# Patient Record
Sex: Female | Born: 1945
Health system: Southern US, Community
[De-identification: ages and names within clinical notes are randomized; demographics above are authoritative.]

## PROBLEM LIST (undated history)

## (undated) DIAGNOSIS — R000 Tachycardia, unspecified: Secondary | ICD-10-CM

## (undated) DIAGNOSIS — I499 Cardiac arrhythmia, unspecified: Secondary | ICD-10-CM

## (undated) DIAGNOSIS — I5189 Other ill-defined heart diseases: Secondary | ICD-10-CM

## (undated) DIAGNOSIS — I471 Supraventricular tachycardia, unspecified: Secondary | ICD-10-CM

## (undated) DIAGNOSIS — E119 Type 2 diabetes mellitus without complications: Secondary | ICD-10-CM

## (undated) DIAGNOSIS — R0600 Dyspnea, unspecified: Secondary | ICD-10-CM

## (undated) DIAGNOSIS — I4510 Unspecified right bundle-branch block: Secondary | ICD-10-CM

## (undated) DIAGNOSIS — I272 Pulmonary hypertension, unspecified: Secondary | ICD-10-CM

## (undated) DIAGNOSIS — F32A Depression, unspecified: Secondary | ICD-10-CM

## (undated) DIAGNOSIS — Y92009 Unspecified place in unspecified non-institutional (private) residence as the place of occurrence of the external cause: Secondary | ICD-10-CM

## (undated) DIAGNOSIS — I2721 Secondary pulmonary arterial hypertension: Secondary | ICD-10-CM

## (undated) DIAGNOSIS — I340 Nonrheumatic mitral (valve) insufficiency: Secondary | ICD-10-CM

## (undated) DIAGNOSIS — Z720 Tobacco use: Secondary | ICD-10-CM

## (undated) DIAGNOSIS — E785 Hyperlipidemia, unspecified: Secondary | ICD-10-CM

## (undated) DIAGNOSIS — I872 Venous insufficiency (chronic) (peripheral): Secondary | ICD-10-CM

## (undated) DIAGNOSIS — W19XXXA Unspecified fall, initial encounter: Secondary | ICD-10-CM

## (undated) DIAGNOSIS — R0609 Other forms of dyspnea: Secondary | ICD-10-CM

## (undated) DIAGNOSIS — I351 Nonrheumatic aortic (valve) insufficiency: Secondary | ICD-10-CM

## (undated) DIAGNOSIS — I2510 Atherosclerotic heart disease of native coronary artery without angina pectoris: Secondary | ICD-10-CM

## (undated) HISTORY — DX: Dyspnea, unspecified: R06.00

## (undated) HISTORY — DX: Hyperlipidemia, unspecified: E78.5

## (undated) HISTORY — DX: Other forms of dyspnea: R06.09

## (undated) HISTORY — PX: MUSCLE BIOPSY: SHX716

## (undated) HISTORY — DX: Tachycardia, unspecified: R00.0

## (undated) HISTORY — DX: Tobacco use: Z72.0

## (undated) HISTORY — DX: Atherosclerotic heart disease of native coronary artery without angina pectoris: I25.10

## (undated) HISTORY — PX: EYE SURGERY: SHX253

## (undated) HISTORY — DX: Venous insufficiency (chronic) (peripheral): I87.2

## (undated) HISTORY — DX: Secondary pulmonary arterial hypertension: I27.21

## (undated) HISTORY — DX: Other ill-defined heart diseases: I51.89

---

## 2001-04-29 HISTORY — PX: JOINT REPLACEMENT: SHX530

## 2004-03-09 ENCOUNTER — Ambulatory Visit: Payer: Self-pay

## 2004-03-14 ENCOUNTER — Ambulatory Visit: Payer: Self-pay | Admitting: Unknown Physician Specialty

## 2004-04-11 ENCOUNTER — Ambulatory Visit: Payer: Self-pay | Admitting: Pain Medicine

## 2004-04-16 ENCOUNTER — Ambulatory Visit: Payer: Self-pay | Admitting: Pain Medicine

## 2004-04-25 ENCOUNTER — Ambulatory Visit: Payer: Self-pay | Admitting: Pain Medicine

## 2004-06-05 ENCOUNTER — Ambulatory Visit: Payer: Self-pay | Admitting: Pain Medicine

## 2004-06-13 ENCOUNTER — Ambulatory Visit: Payer: Self-pay | Admitting: Pain Medicine

## 2004-07-02 ENCOUNTER — Ambulatory Visit: Payer: Self-pay | Admitting: Pain Medicine

## 2004-07-04 ENCOUNTER — Ambulatory Visit: Payer: Self-pay | Admitting: Pain Medicine

## 2004-08-07 ENCOUNTER — Ambulatory Visit: Payer: Self-pay | Admitting: Pain Medicine

## 2004-08-15 ENCOUNTER — Ambulatory Visit: Payer: Self-pay | Admitting: Pain Medicine

## 2004-09-11 ENCOUNTER — Ambulatory Visit: Payer: Self-pay | Admitting: Pain Medicine

## 2004-09-17 ENCOUNTER — Ambulatory Visit: Payer: Self-pay | Admitting: Pain Medicine

## 2004-10-01 ENCOUNTER — Ambulatory Visit: Payer: Self-pay | Admitting: Pain Medicine

## 2004-10-09 ENCOUNTER — Ambulatory Visit: Payer: Self-pay | Admitting: Pain Medicine

## 2004-10-22 ENCOUNTER — Ambulatory Visit: Payer: Self-pay | Admitting: Pain Medicine

## 2004-11-08 ENCOUNTER — Ambulatory Visit: Payer: Self-pay | Admitting: Pain Medicine

## 2004-12-24 ENCOUNTER — Encounter: Payer: Self-pay | Admitting: Internal Medicine

## 2004-12-28 ENCOUNTER — Encounter: Payer: Self-pay | Admitting: Internal Medicine

## 2005-01-27 ENCOUNTER — Encounter: Payer: Self-pay | Admitting: Internal Medicine

## 2005-03-13 ENCOUNTER — Ambulatory Visit: Payer: Self-pay

## 2006-06-06 ENCOUNTER — Emergency Department (HOSPITAL_COMMUNITY): Admission: EM | Admit: 2006-06-06 | Discharge: 2006-06-06 | Payer: Self-pay | Admitting: Family Medicine

## 2006-06-21 ENCOUNTER — Ambulatory Visit: Payer: Self-pay | Admitting: Unknown Physician Specialty

## 2006-09-10 ENCOUNTER — Emergency Department (HOSPITAL_COMMUNITY): Admission: EM | Admit: 2006-09-10 | Discharge: 2006-09-10 | Payer: Self-pay | Admitting: Emergency Medicine

## 2007-03-27 ENCOUNTER — Emergency Department: Payer: Self-pay | Admitting: Emergency Medicine

## 2007-03-27 ENCOUNTER — Other Ambulatory Visit: Payer: Self-pay

## 2007-03-31 ENCOUNTER — Ambulatory Visit: Payer: Self-pay | Admitting: Emergency Medicine

## 2007-08-14 ENCOUNTER — Emergency Department: Payer: Self-pay | Admitting: Emergency Medicine

## 2007-08-14 ENCOUNTER — Other Ambulatory Visit: Payer: Self-pay

## 2009-04-12 ENCOUNTER — Inpatient Hospital Stay: Payer: Self-pay | Admitting: Internal Medicine

## 2009-05-25 ENCOUNTER — Ambulatory Visit: Payer: Self-pay | Admitting: Internal Medicine

## 2009-08-20 ENCOUNTER — Emergency Department: Payer: Self-pay | Admitting: Internal Medicine

## 2009-08-23 ENCOUNTER — Emergency Department: Payer: Self-pay | Admitting: Emergency Medicine

## 2012-07-09 ENCOUNTER — Ambulatory Visit: Payer: Self-pay | Admitting: Internal Medicine

## 2013-05-06 ENCOUNTER — Ambulatory Visit: Payer: Self-pay | Admitting: Physical Medicine and Rehabilitation

## 2013-06-20 LAB — BASIC METABOLIC PANEL
ANION GAP: 5 — AB (ref 7–16)
BUN: 8 mg/dL (ref 7–18)
CALCIUM: 9.2 mg/dL (ref 8.5–10.1)
Chloride: 105 mmol/L (ref 98–107)
Co2: 31 mmol/L (ref 21–32)
Creatinine: 0.9 mg/dL (ref 0.60–1.30)
EGFR (Non-African Amer.): >60
GLUCOSE: 120 mg/dL — AB (ref 65–99)
Osmolality: 281 (ref 275–301)
Potassium: 4.8 mmol/L (ref 3.5–5.1)
SODIUM: 141 mmol/L (ref 136–145)

## 2013-06-20 LAB — CBC WITH DIFFERENTIAL/PLATELET
Basophil #: 0.1 10*3/uL (ref 0.0–0.1)
Basophil %: 0.8 %
Eosinophil #: 0.8 10*3/uL — ABNORMAL HIGH (ref 0.0–0.7)
Eosinophil %: 6.7 %
HCT: 40.7 % (ref 35.0–47.0)
HGB: 13.4 g/dL (ref 12.0–16.0)
Lymphocyte #: 4.7 10*3/uL — ABNORMAL HIGH (ref 1.0–3.6)
Lymphocyte %: 39.5 %
MCH: 31.1 pg (ref 26.0–34.0)
MCHC: 33 g/dL (ref 32.0–36.0)
MCV: 94 fL (ref 80–100)
Monocyte #: 0.5 x10 3/mm (ref 0.2–0.9)
Monocyte %: 4 %
Neutrophil #: 5.9 10*3/uL (ref 1.4–6.5)
Neutrophil %: 49 %
Platelet: 252 10*3/uL (ref 150–440)
RBC: 4.31 10*6/uL (ref 3.80–5.20)
RDW: 13.8 % (ref 11.5–14.5)
WBC: 12 10*3/uL — ABNORMAL HIGH (ref 3.6–11.0)

## 2013-06-20 LAB — URINALYSIS, COMPLETE
BILIRUBIN, UR: NEGATIVE
Blood: NEGATIVE
Glucose,UR: NEGATIVE mg/dL (ref 0–75)
Ketone: NEGATIVE
Nitrite: NEGATIVE
PH: 7 (ref 4.5–8.0)
Protein: NEGATIVE
RBC,UR: 2 /HPF (ref 0–5)
SPECIFIC GRAVITY: 1.014 (ref 1.003–1.030)
SQUAMOUS EPITHELIAL: NONE SEEN
WBC UR: 83 /HPF (ref 0–5)

## 2013-06-20 LAB — DRUG SCREEN, URINE
AMPHETAMINES, UR SCREEN: NEGATIVE (ref ?–1000)
BENZODIAZEPINE, UR SCRN: POSITIVE (ref ?–200)
Barbiturates, Ur Screen: NEGATIVE (ref ?–200)
COCAINE METABOLITE, UR ~~LOC~~: NEGATIVE (ref ?–300)
Cannabinoid 50 Ng, Ur ~~LOC~~: NEGATIVE (ref ?–50)
MDMA (ECSTASY) UR SCREEN: NEGATIVE (ref ?–500)
Methadone, Ur Screen: NEGATIVE (ref ?–300)
Opiate, Ur Screen: POSITIVE (ref ?–300)
PHENCYCLIDINE (PCP) UR S: NEGATIVE (ref ?–25)
TRICYCLIC, UR SCREEN: NEGATIVE (ref ?–1000)

## 2013-06-20 LAB — ETHANOL: Ethanol: <3 mg/dL

## 2013-06-20 LAB — ACETAMINOPHEN LEVEL: Acetaminophen: <2 ug/mL

## 2013-06-20 LAB — SALICYLATE LEVEL: Salicylates, Serum: 2.4 mg/dL

## 2013-06-20 LAB — TROPONIN I: Troponin-I: <0.02 ng/mL

## 2013-06-21 ENCOUNTER — Inpatient Hospital Stay: Payer: Self-pay | Admitting: Internal Medicine

## 2013-06-22 LAB — BASIC METABOLIC PANEL
ANION GAP: 7 (ref 7–16)
BUN: 7 mg/dL (ref 7–18)
CO2: 28 mmol/L (ref 21–32)
Calcium, Total: 9.1 mg/dL (ref 8.5–10.1)
Chloride: 108 mmol/L — ABNORMAL HIGH (ref 98–107)
Creatinine: 0.74 mg/dL (ref 0.60–1.30)
EGFR (Non-African Amer.): >60
Glucose: 121 mg/dL — ABNORMAL HIGH (ref 65–99)
OSMOLALITY: 284 (ref 275–301)
Potassium: 4.1 mmol/L (ref 3.5–5.1)
Sodium: 143 mmol/L (ref 136–145)

## 2013-06-22 LAB — MAGNESIUM: MAGNESIUM: 1.6 mg/dL — AB

## 2013-06-22 LAB — TSH: Thyroid Stimulating Horm: 0.616 u[IU]/mL

## 2013-06-24 LAB — URINE CULTURE

## 2013-09-29 DIAGNOSIS — M48062 Spinal stenosis, lumbar region with neurogenic claudication: Secondary | ICD-10-CM | POA: Insufficient documentation

## 2013-09-29 DIAGNOSIS — I509 Heart failure, unspecified: Secondary | ICD-10-CM | POA: Insufficient documentation

## 2013-09-29 DIAGNOSIS — M5136 Other intervertebral disc degeneration, lumbar region: Secondary | ICD-10-CM | POA: Insufficient documentation

## 2013-09-29 DIAGNOSIS — M5416 Radiculopathy, lumbar region: Secondary | ICD-10-CM | POA: Insufficient documentation

## 2014-01-09 ENCOUNTER — Emergency Department: Payer: Self-pay | Admitting: Emergency Medicine

## 2014-01-09 LAB — URINALYSIS, COMPLETE
BILIRUBIN, UR: NEGATIVE
Bacteria: NONE SEEN
Glucose,UR: NEGATIVE mg/dL (ref 0–75)
KETONE: NEGATIVE
Nitrite: NEGATIVE
PH: 6 (ref 4.5–8.0)
Protein: NEGATIVE
Specific Gravity: 1.013 (ref 1.003–1.030)

## 2014-01-09 LAB — CBC
HCT: 43.1 % (ref 35.0–47.0)
HGB: 13.9 g/dL (ref 12.0–16.0)
MCH: 30.7 pg (ref 26.0–34.0)
MCHC: 32.3 g/dL (ref 32.0–36.0)
MCV: 95 fL (ref 80–100)
Platelet: 241 10*3/uL (ref 150–440)
RBC: 4.54 10*6/uL (ref 3.80–5.20)
RDW: 13.8 % (ref 11.5–14.5)
WBC: 12.7 10*3/uL — ABNORMAL HIGH (ref 3.6–11.0)

## 2014-01-09 LAB — BASIC METABOLIC PANEL
Anion Gap: 5 — ABNORMAL LOW (ref 7–16)
BUN: 11 mg/dL (ref 7–18)
CALCIUM: 9.6 mg/dL (ref 8.5–10.1)
CO2: 31 mmol/L (ref 21–32)
CREATININE: 0.93 mg/dL (ref 0.60–1.30)
Chloride: 105 mmol/L (ref 98–107)
EGFR (Non-African Amer.): >60
Glucose: 155 mg/dL — ABNORMAL HIGH (ref 65–99)
Osmolality: 284 (ref 275–301)
POTASSIUM: 3.8 mmol/L (ref 3.5–5.1)
Sodium: 141 mmol/L (ref 136–145)

## 2014-01-21 ENCOUNTER — Ambulatory Visit: Payer: Self-pay | Admitting: Neurology

## 2014-04-20 ENCOUNTER — Observation Stay: Payer: Self-pay | Admitting: Internal Medicine

## 2014-04-20 LAB — COMPREHENSIVE METABOLIC PANEL
ALK PHOS: 90 U/L
ALT: 57 U/L
Albumin: 3.4 g/dL (ref 3.4–5.0)
Anion Gap: 5 — ABNORMAL LOW (ref 7–16)
BUN: 9 mg/dL (ref 7–18)
Bilirubin,Total: 0.6 mg/dL (ref 0.2–1.0)
CHLORIDE: 100 mmol/L (ref 98–107)
CO2: 33 mmol/L — AB (ref 21–32)
Calcium, Total: 9 mg/dL (ref 8.5–10.1)
Creatinine: 0.88 mg/dL (ref 0.60–1.30)
Glucose: 144 mg/dL — ABNORMAL HIGH (ref 65–99)
Osmolality: 277 (ref 275–301)
Potassium: 3.7 mmol/L (ref 3.5–5.1)
SGOT(AST): 87 U/L — ABNORMAL HIGH (ref 15–37)
SODIUM: 138 mmol/L (ref 136–145)
Total Protein: 6.8 g/dL (ref 6.4–8.2)

## 2014-04-20 LAB — URINALYSIS, COMPLETE
Bacteria: NONE SEEN
Bilirubin,UR: NEGATIVE
Blood: NEGATIVE
Glucose,UR: NEGATIVE mg/dL (ref 0–75)
Ketone: NEGATIVE
Leukocyte Esterase: NEGATIVE
Nitrite: NEGATIVE
Ph: 5 (ref 4.5–8.0)
Protein: NEGATIVE
RBC,UR: <1 /HPF (ref 0–5)
Specific Gravity: 1.014 (ref 1.003–1.030)
Squamous Epithelial: <1
WBC UR: 1 /HPF (ref 0–5)

## 2014-04-20 LAB — DRUG SCREEN, URINE
Amphetamines, Ur Screen: NEGATIVE (ref ?–1000)
BENZODIAZEPINE, UR SCRN: POSITIVE (ref ?–200)
Barbiturates, Ur Screen: NEGATIVE (ref ?–200)
CANNABINOID 50 NG, UR ~~LOC~~: NEGATIVE (ref ?–50)
Cocaine Metabolite,Ur ~~LOC~~: NEGATIVE (ref ?–300)
MDMA (Ecstasy)Ur Screen: NEGATIVE (ref ?–500)
METHADONE, UR SCREEN: NEGATIVE (ref ?–300)
OPIATE, UR SCREEN: POSITIVE (ref ?–300)
Phencyclidine (PCP) Ur S: NEGATIVE (ref ?–25)
Tricyclic, Ur Screen: NEGATIVE (ref ?–1000)

## 2014-04-20 LAB — ETHANOL: Ethanol: <3 mg/dL

## 2014-04-20 LAB — CBC
HCT: 39.4 % (ref 35.0–47.0)
HGB: 12.7 g/dL (ref 12.0–16.0)
MCH: 30.7 pg (ref 26.0–34.0)
MCHC: 32.2 g/dL (ref 32.0–36.0)
MCV: 95 fL (ref 80–100)
PLATELETS: 201 10*3/uL (ref 150–440)
RBC: 4.13 10*6/uL (ref 3.80–5.20)
RDW: 13.8 % (ref 11.5–14.5)
WBC: 11.9 10*3/uL — ABNORMAL HIGH (ref 3.6–11.0)

## 2014-04-20 LAB — TROPONIN I: Troponin-I: <0.02 ng/mL

## 2014-04-21 LAB — TROPONIN I: Troponin-I: <0.02 ng/mL

## 2014-04-21 LAB — HEPATIC FUNCTION PANEL A (ARMC)
ALBUMIN: 3.2 g/dL — AB (ref 3.4–5.0)
AST: 61 U/L — AB (ref 15–37)
Alkaline Phosphatase: 88 U/L
Bilirubin, Direct: <0.1 mg/dL (ref 0.0–0.2)
Bilirubin,Total: 0.5 mg/dL (ref 0.2–1.0)
SGPT (ALT): 51 U/L
Total Protein: 6.3 g/dL — ABNORMAL LOW (ref 6.4–8.2)

## 2014-04-21 LAB — CK-MB
CK-MB: 0.6 ng/mL (ref 0.5–3.6)
CK-MB: 0.7 ng/mL (ref 0.5–3.6)
CK-MB: <0.5 ng/mL — ABNORMAL LOW (ref 0.5–3.6)

## 2014-04-21 LAB — HEMOGLOBIN A1C: Hemoglobin A1C: 8 % — ABNORMAL HIGH (ref 4.2–6.3)

## 2014-04-21 LAB — CK: CK, Total: 59 U/L (ref 26–192)

## 2014-04-28 ENCOUNTER — Telehealth: Payer: Self-pay

## 2014-04-28 NOTE — Telephone Encounter (Signed)
Called pt to schedule F/U from ED, pt states she has an appt with her PCP the 1st week of January, and does not want to make an appt.

## 2014-05-16 ENCOUNTER — Inpatient Hospital Stay: Payer: Self-pay | Admitting: Internal Medicine

## 2014-05-16 LAB — URINALYSIS, COMPLETE
Bacteria: NONE SEEN
Bilirubin,UR: NEGATIVE
Blood: NEGATIVE
Glucose,UR: NEGATIVE mg/dL (ref 0–75)
Ketone: NEGATIVE
LEUKOCYTE ESTERASE: NEGATIVE
NITRITE: NEGATIVE
PH: 6 (ref 4.5–8.0)
PROTEIN: NEGATIVE
RBC,UR: <1 /HPF (ref 0–5)
Specific Gravity: 1.011 (ref 1.003–1.030)
Squamous Epithelial: 1

## 2014-05-16 LAB — COMPREHENSIVE METABOLIC PANEL
ALBUMIN: 3.4 g/dL (ref 3.4–5.0)
ALK PHOS: 77 U/L
AST: 36 U/L (ref 15–37)
Anion Gap: 9 (ref 7–16)
BUN: 6 mg/dL — AB (ref 7–18)
Bilirubin,Total: 0.5 mg/dL (ref 0.2–1.0)
Calcium, Total: 8.1 mg/dL — ABNORMAL LOW (ref 8.5–10.1)
Chloride: 100 mmol/L (ref 98–107)
Co2: 28 mmol/L (ref 21–32)
Creatinine: 0.96 mg/dL (ref 0.60–1.30)
EGFR (African American): >60
EGFR (Non-African Amer.): >60
GLUCOSE: 222 mg/dL — AB (ref 65–99)
Osmolality: 278 (ref 275–301)
Potassium: 3.2 mmol/L — ABNORMAL LOW (ref 3.5–5.1)
SGPT (ALT): 27 U/L
Sodium: 137 mmol/L (ref 136–145)
TOTAL PROTEIN: 6.6 g/dL (ref 6.4–8.2)

## 2014-05-16 LAB — CBC WITH DIFFERENTIAL/PLATELET
BASOS ABS: 0.1 10*3/uL (ref 0.0–0.1)
BASOS PCT: 0.4 %
EOS PCT: 1.1 %
Eosinophil #: 0.2 10*3/uL (ref 0.0–0.7)
HCT: 39.2 % (ref 35.0–47.0)
HGB: 12.5 g/dL (ref 12.0–16.0)
LYMPHS PCT: 8.1 %
Lymphocyte #: 1.2 10*3/uL (ref 1.0–3.6)
MCH: 29.9 pg (ref 26.0–34.0)
MCHC: 31.9 g/dL — ABNORMAL LOW (ref 32.0–36.0)
MCV: 94 fL (ref 80–100)
Monocyte #: 0.3 x10 3/mm (ref 0.2–0.9)
Monocyte %: 1.8 %
NEUTROS ABS: 13.3 10*3/uL — AB (ref 1.4–6.5)
Neutrophil %: 88.6 %
Platelet: 190 10*3/uL (ref 150–440)
RBC: 4.18 10*6/uL (ref 3.80–5.20)
RDW: 13.8 % (ref 11.5–14.5)
WBC: 15 10*3/uL — ABNORMAL HIGH (ref 3.6–11.0)

## 2014-05-16 LAB — TROPONIN I: Troponin-I: <0.02 ng/mL

## 2014-05-17 LAB — CBC WITH DIFFERENTIAL/PLATELET
Basophil #: 0 10*3/uL (ref 0.0–0.1)
Basophil %: 0.1 %
Eosinophil #: 0.1 10*3/uL (ref 0.0–0.7)
Eosinophil %: 0.3 %
HCT: 37.2 % (ref 35.0–47.0)
HGB: 11.8 g/dL — AB (ref 12.0–16.0)
Lymphocyte #: 4.1 10*3/uL — ABNORMAL HIGH (ref 1.0–3.6)
Lymphocyte %: 14.2 %
MCH: 30.2 pg (ref 26.0–34.0)
MCHC: 31.8 g/dL — AB (ref 32.0–36.0)
MCV: 95 fL (ref 80–100)
MONOS PCT: 4 %
Monocyte #: 1.1 x10 3/mm — ABNORMAL HIGH (ref 0.2–0.9)
NEUTROS ABS: 23.3 10*3/uL — AB (ref 1.4–6.5)
Neutrophil %: 81.4 %
Platelet: 181 10*3/uL (ref 150–440)
RBC: 3.91 10*6/uL (ref 3.80–5.20)
RDW: 13.8 % (ref 11.5–14.5)
WBC: 28.6 10*3/uL — ABNORMAL HIGH (ref 3.6–11.0)

## 2014-05-17 LAB — BASIC METABOLIC PANEL
Anion Gap: 11 (ref 7–16)
BUN: 7 mg/dL (ref 7–18)
CALCIUM: 8.1 mg/dL — AB (ref 8.5–10.1)
CHLORIDE: 109 mmol/L — AB (ref 98–107)
Co2: 24 mmol/L (ref 21–32)
Creatinine: 0.92 mg/dL (ref 0.60–1.30)
EGFR (African American): >60
Glucose: 75 mg/dL (ref 65–99)
OSMOLALITY: 284 (ref 275–301)
POTASSIUM: 2.9 mmol/L — AB (ref 3.5–5.1)
SODIUM: 144 mmol/L (ref 136–145)

## 2014-05-17 LAB — MAGNESIUM: Magnesium: 0.9 mg/dL — ABNORMAL LOW

## 2014-05-17 LAB — URINE CULTURE

## 2014-05-18 LAB — BASIC METABOLIC PANEL
Anion Gap: 10 (ref 7–16)
BUN: 6 mg/dL — AB (ref 7–18)
CALCIUM: 8.6 mg/dL (ref 8.5–10.1)
Chloride: 108 mmol/L — ABNORMAL HIGH (ref 98–107)
Co2: 22 mmol/L (ref 21–32)
Creatinine: 0.69 mg/dL (ref 0.60–1.30)
Glucose: 97 mg/dL (ref 65–99)
OSMOLALITY: 277 (ref 275–301)
Potassium: 3.7 mmol/L (ref 3.5–5.1)
Sodium: 140 mmol/L (ref 136–145)

## 2014-05-18 LAB — MAGNESIUM: Magnesium: 1.7 mg/dL — ABNORMAL LOW

## 2014-05-18 LAB — CBC WITH DIFFERENTIAL/PLATELET
BASOS PCT: 0.3 %
Basophil #: 0.1 10*3/uL (ref 0.0–0.1)
Eosinophil #: 0.2 10*3/uL (ref 0.0–0.7)
Eosinophil %: 0.9 %
HCT: 36.1 % (ref 35.0–47.0)
HGB: 11.6 g/dL — AB (ref 12.0–16.0)
LYMPHS PCT: 14.4 %
Lymphocyte #: 2.4 10*3/uL (ref 1.0–3.6)
MCH: 30.6 pg (ref 26.0–34.0)
MCHC: 32.2 g/dL (ref 32.0–36.0)
MCV: 95 fL (ref 80–100)
MONO ABS: 0.5 x10 3/mm (ref 0.2–0.9)
Monocyte %: 2.9 %
Neutrophil #: 13.8 10*3/uL — ABNORMAL HIGH (ref 1.4–6.5)
Neutrophil %: 81.5 %
Platelet: 178 10*3/uL (ref 150–440)
RBC: 3.8 10*6/uL (ref 3.80–5.20)
RDW: 14 % (ref 11.5–14.5)
WBC: 16.9 10*3/uL — ABNORMAL HIGH (ref 3.6–11.0)

## 2014-05-19 LAB — CBC WITH DIFFERENTIAL/PLATELET
BASOS ABS: 0.1 10*3/uL (ref 0.0–0.1)
Basophil %: 0.4 %
Eosinophil #: 0.3 10*3/uL (ref 0.0–0.7)
Eosinophil %: 2.1 %
HCT: 36.1 % (ref 35.0–47.0)
HGB: 11.6 g/dL — ABNORMAL LOW (ref 12.0–16.0)
LYMPHS ABS: 2.4 10*3/uL (ref 1.0–3.6)
Lymphocyte %: 18.1 %
MCH: 30 pg (ref 26.0–34.0)
MCHC: 32 g/dL (ref 32.0–36.0)
MCV: 94 fL (ref 80–100)
MONO ABS: 0.6 x10 3/mm (ref 0.2–0.9)
Monocyte %: 4.6 %
NEUTROS ABS: 10.1 10*3/uL — AB (ref 1.4–6.5)
Neutrophil %: 74.8 %
Platelet: 207 10*3/uL (ref 150–440)
RBC: 3.85 10*6/uL (ref 3.80–5.20)
RDW: 13.9 % (ref 11.5–14.5)
WBC: 13.5 10*3/uL — AB (ref 3.6–11.0)

## 2014-05-19 LAB — MAGNESIUM: Magnesium: 1.8 mg/dL

## 2014-05-21 LAB — CULTURE, BLOOD (SINGLE)

## 2014-06-02 ENCOUNTER — Ambulatory Visit: Payer: Self-pay | Admitting: Internal Medicine

## 2014-08-20 NOTE — Discharge Summary (Signed)
PATIENT NAME:  Dana [name], Dana [name] MR#:  [id number] DATE OF BIRTH:  1946/01/15  DATE OF ADMISSION:  06/21/2013 DATE OF DISCHARGE:  06/23/2013  PRIMARY CARE PHYSICIAN:  Dr. Arlana Pouchate.  FINAL DIAGNOSES: 1.  Acute encephalopathy.  2.  Urinary tract infection.  3.  Anxiety and depression.  4.  Chronic pain and neuropathy.   MEDICATIONS ON DISCHARGE:  Include metformin 500 mg twice [name] day, aspirin 325 mg daily, Xanax 1 mg every 4 to 6 hours as needed, vitamin E 400 international units daily, simvastatin 40 mg daily, gabapentin 300 mg 3 times [name] day, Cymbalta 60 mg daily, fish oil 1 capsule daily, nicotine patch 21 mg per transdermal film one patch daily, Cipro 500 mg q. 12 hours for five days.  Stop taking Flexeril, tizanidine and Motrin.   DIET:  Carbohydrate-controlled diet, regular consistency.   ACTIVITY:  As tolerated.   FOLLOW-UP:  In 1 to 2 weeks with Dr. Dewaine Oatsenny Tate.   HOSPITAL COURSE:  The patient was admitted 06/21/2013, discharged 06/23/2013, came in with altered mental status, was admitted with altered mental status, likely secondary to urinary tract infection.  The patient was started on IV Rocephin.   LABORATORY AND RADIOLOGICAL DATA DURING THE HOSPITAL COURSE:  Included [name] urine toxicology that was positive for benzodiazepines and opiates.  Urinalysis, 3+ leukocyte esterase.  Salicylates 2.4.  Acetaminophen less than 2.  Ethanol level less than 3. Troponin negative.  Glucose 120, BUN 8, creatinine 0.90, sodium 141, potassium 4.8, chloride 105, CO2 31, calcium 9.2.  White blood cell count 12.0, hemoglobin 13.4, hematocrit 40.7, platelet count of 252.  CT scan of the head is no acute intracranial process, mild to moderate white matter changes progressed from prior examination suggesting chronic small vessel ischemic disease, remote right thalamic lacunar infarct.  TSH 0.616, magnesium 1.6.  MRI of the brain, chronic microvascular ischemic disease.  No acute infarct.  Unfortunately, no urine  culture sent from the Emergency Room.  I sent [name] urine culture on 06/23/2013 that showed no growth.  I sent the patient home on 06/23/2013.   HOSPITAL COURSE PER PROBLEM LIST:  1.  For the patient's acute encephalopathy, likely secondary to UTI, could also be tizanidine.  I did stop this medication because it does interact with the Cipro that I did start the patient on for her UTI.  The patient's mental status is normal on her usual Xanax and gabapentin, so I do not think it is these medications.  The patient does not take any opiates, but [name] urine toxicology was positive for opiates.  I am not sure if [name] false positive can happen with the tizanidine.  2.  UTI.  Unfortunately, no urine culture was sent from the ER.  I did treat empirically with Rocephin, sent her home on Cipro.  3.  Anxiety and depression.  No medication changes were made for that.  4.  Chronic pain and neuropathy.  We did stop the tizanidine.   Time spent on discharge 35 minutes.   ____________________________ Herschell Dimesichard J. Renae GlossWieting, MD rjw:ea D: 06/24/2013 16:13:51 ET T: 06/25/2013 03:20:54 ET JOB#: [id number]  cc: Herschell Dimesichard J. Renae GlossWieting, MD, <Dictator> Jillene Bucksenny C. Arlana Pouchate, MD Salley ScarletICHARD J Yariel Ferraris MD ELECTRONICALLY SIGNED 06/28/2013 13:28

## 2014-08-20 NOTE — Discharge Summary (Signed)
PATIENT NAME:  Dana [name], ROSMAN MR#:  [id number] DATE OF BIRTH:  09/02/1945  DATE OF ADMISSION:  04/20/2014 DATE OF DISCHARGE:  04/21/2014  ADMITTING DIAGNOSIS: Transient ischemic attack.    DISCHARGE DIAGNOSES:  1.  Altered mental status of unclear etiology with slurred speech,  as well as unsteady gait, as well as confusion. No stroke.  2.  Suspected obstructive sleep apnea.  3.  Back pain and known degenerative disk disease. 4.  Diabetes mellitus with hemoglobin A1c 8.0, type 2, poorly controlled.  5.  Elevated transaminases of unclear etiology.  6.  Ongoing tobacco abuse.  7.  Obesity.  8.  Hyperlipidemia.  9.  Depression.   DISCHARGE CONDITION: Stable.   DISCHARGE MEDICATIONS: The patient is to continue: 1. Gabapentin 300 mg 3 times daily. 2. Aspirin 325 mg p.o. daily. 3. Cymbalta 60 mg p.o. at bedtime, docusate sodium 100 mg twice daily as needed. 4. Fish oil 1200 mg p.o. twice daily. 5. Metformin extended-release 500 mg 2 tablets twice daily which would be 1000 mg twice daily. 6. Simvastatin 40 mg p.o. at bedtime.   7. A (Dictation Anomaly) <<MISSING TEXT>> 4 mg 3 times daily.  8. Vitamin E 400 units once daily.  9. Alprazolam 1 mg every 4 to 6 hours as needed.  10. Norco 5/325 mg 1 tablet every 8 hours as needed for severe pain.  11. Glipizide extended-release 5 mg p.o. daily.   HOME OXYGEN: None.   DIET: 2 grams salt, low-fat, low-cholesterol, diabetic diet, regular consistency.   ACTIVITY LIMITATIONS: As tolerated.   The patient is being referred to sleep study as outpatient.    FOLLOWUP APPOINTMENTS: Dr. Mariah Milling for echocardiogram in the next few days after discharge in his office. Also a sleep study, which she was recommended to get sleep study as soon as possible in Dr. Maree Krabbe office in 2 days after discharge.  Dr. Laban Emperor in 2 days after discharge for pain management, and further recommendations in regards to back pain issues.   CONSULTANTS: Care  management, social work.   RADIOLOGIC STUDIES: CT scan of head without contrast, 04/20/2014, showed no acute intracranial abnormality. MRI of brain without contrast, 04/21/2014, revealed a stable examination, chronic changes, but no acute changes noted. Carotid ultrasound, 04/21/2014, showed a trace predominantly hypoechoic and smooth atherosclerotic plaque in the bilateral proximal internal carotid arteries without evidence of stenosis. Vertebral arteries remain patent with normal antegrade flow.   HOSPITAL COURSE: The patient is a 69 year old Caucasian female who was brought to the Emergency Room because of back pain, as well as slurred speech and frequent falls. Please refer to Dr. (Dictation Anomaly)<<MISSING TEXT>> admission note on 04/20/2014. According to the patient's family, the patient had intermittent wobbly walk as well as slurring speech for the past few months. She was brought to Emergency Room because of back pains and admitted due to concerns of TIA.   On arrival to the hospital, the patient's vital signs: Temperature was 97.7, pulse was 96, respiration rate was 20, blood pressure 138/92, saturation was 94% on room air. Physical exam was unremarkable.   The patient's lab data done on arrival to the hospital showed elevation of the CO2 level of 33, glucose level of 144, otherwise BMP was unremarkable. Alcohol level was less than 3. Liver enzymes showed elevation of AST to 87. Cardiac enzymes x3 were normal. Urine drug screen was positive for benzodiazepines as well as opiates. The patient's CBC, white blood cell count was 11.9, hemoglobin was 12.7, platelet count  was 201,000, urinalysis was unremarkable. EKG showed normal sinus rhythm at 82 beats per minute, normal axis, possible left atrial enlargement. (Dictation Anomaly) <<MISSING TEXT>> in V1 suggesting right ventricular conduction delay, but no acute ST or T changes were noted. Chest x-ray was not performed. The patient was admitted to  the hospital for further evaluation. She underwent stroke evaluation. She had MRI of her brain done, which showed no stroke. She also had a carotid ultrasound, which also did not show any significant abnormalities, except a small smooth plaque. It was felt that the patient's altered mental status was of unclear etiology at this point, probably due to the patient's elevation of CO2 level, obstructive sleep apnea was implicated in her condition. The patient did have ABGs done while she was in the hospital and ABGs revealed pH of 7.43, pCO2 was 40, pO2 was 71. Saturation was 94.5% on 21% FiO2. The patient's lactic acid level was found to be elevated at 3.2. Since the patient's ABGs were unremarkable, no further interventions were performed while she was in the hospital; however, the patient was advised to get a sleep study as an outpatient. In regards to back pains and known history of degenerative disk disease, the patient was advised to follow up with Dr. Laban EmperorNaveira for further recommendations. No further studies were performed due to recent evaluation, according to the patient, with MRI, results of which are not available at this time  on the hospital computer.    Regarding diabetes mellitus, the patient's hemoglobin A1c was checked and was found to be markedly elevated at 8.0 and the glipizide was initiated. It is recommended to follow the patient's hemoglobin A1c and make decisions about advancement of her diabetic medications given  higher as needed to keep her hemoglobin A1c below 7, if possible.   In regards to patients elevated transaminases reasons for elevated transaminases remained unclear. However, fatty liver was implicated since the patient was overall asymptomatic. It is recommended to follow the patient's liver function test as outpatient. Her liver tests was repeated on the 04/21/2014, and the patient's AST was improved at 61.  Regarding tobacco abuse, the patient was counseled and recommended  replacement therapy.   For obesity, the patient was recommended to get obstructive sleep apnea evaluation.   For hyperlipidemia, the patient is to continue her outpatient medications. No other changes were made; however, the patient was advised to get her lipid panel checked as outpatient by her primary care physician.   For depression, the patient is to continue Cymbalta.   The patient is being discharged in stable condition with the above-mentioned medications and follow-up. On the day of discharge, temperature was 97.8, pulse was 86, respiration rate was 18, blood pressure 159/84, saturation was 97% on room air at rest.   TIME SPENT:  Was 40 minutes.   ____________________________ Katharina Caperima Kalayah Leske, MD rv:mw D: 04/21/2014 18:28:48 ET T: 04/21/2014 19:27:37 ET JOB#: [id number]  cc: Katharina Caperima Keeleigh Terris, MD, <Dictator> Antonieta Ibaimothy J. Gollan, MD Jillene Bucksenny C. Arlana Pouchate, MD Francisco A. Laban EmperorNaveira

## 2014-08-20 NOTE — H&P (Signed)
PATIENT NAME:  Dana Dana [name], Dana Dana [name] MR#:  [id number] DATE OF BIRTH:  09/11/1945  DATE OF ADMISSION:  04/20/2014  PRIMARY CARE PHYSICIAN: Katherina Rightenny C. Arlana Pouchate, MD  REFERRING PHYSICIAN: Kathreen DevoidKevin Dana [name]. Paduchowski, MD  CHIEF COMPLAINT: Slurred speech, frequent falls.   HISTORY OF PRESENT ILLNESS: Dana Dana [name] is Dana [name] 69 year old female with Dana [name] history of chronic back pain, diabetes mellitus, hypertension, hyperlipidemia brought to the Emergency Department by family with complaints of slurred speech for the last 2 months and frequent falls. Per granddaughter, who is at bedside, states that the patient falls down 3-4 times Dana [name] day, being wobbly with slurred speech. The slurred speech is off and on. The patient has been having severe back pain with right sciatica. The patient was started on Norco for the last 4-5 days. The patient has been having more slurred speech. Concerning this, the patient is brought to the Emergency Department. Workup in the Emergency Department with Dana [name] CT head without contrast is unremarkable. No other obvious signs of infection. The patient is on multiple sedative medications. Denies having any weakness in any part of the body. Denies having any chest pain.   PAST MEDICAL HISTORY:  1.  Diabetes mellitus.  2.  Chronic back pain.  3.  Hyperlipidemia.  4.  Depression.  5.  Left knee replacement.   ALLERGIES:  1.  AUGMENTIN.   2.  ERYTHROMYCIN.   HOME MEDICATIONS:  1.  Vitamin E 400 units daily.  2.  Tizanidine 4 milligrams 3 times Dana [name] day.  3.  Simvastatin 40 mg once Dana [name] day.  4.  Metformin 500 mg 2 tablets once Dana [name] day.  5.  Gabapentin 300 mg 3 times Dana [name] day.  6.  Fish oil 1200 mg 2 times Dana [name] day.  7.  Docusate sodium 100 mg 2 times Dana [name] day.  8.  Cymbalta 60 mg once Dana [name] day.  9.  Aspirin 325 mg once Dana [name] day.  10.  Allopurinol 1 mg every 4-6 hours as needed.   SOCIAL HISTORY: Smoke 5-6 cigarettes Dana [name] day. Denies drinking any alcohol or using any illicit drugs. Lives with her family.   FAMILY  HISTORY: Sister with CVA. Father with diabetes mellitus. Sister with liver cancer.   REVIEW OF SYSTEMS:  CONSTITUTIONAL: Generalized weakness.  EYES: No change in vision.  EAR, NOSE, THROAT: No change in hearing.  RESPIRATORY: No cough, shortness of breath.  CARDIOVASCULAR: No chest pain, palpations.  GASTROINTESTINAL: No nausea, vomiting, abdominal pain.  GENITOURINARY: No dysuria or hematuria.  HEMATOLOGIC: No easy bruising or bleeding.  SKIN: No rash or lesions.  ENDOCRINE: Patient has Dana [name] diagnosis of diabetes mellitus.  MUSCULOSKELETAL: Has chronic back pain.  NEUROLOGIC: Has right sciatica.   PHYSICAL EXAMINATION:  GENERAL: This is Dana [name] well-developed, well-nourished, obese female lying down in the bed not in distress, is able to speak clearly.  VITAL SIGNS: Temperature 97.7, pulse 96, blood pressure 138/92, respiratory rate of 20, oxygen saturation 94% on room air.  HEENT: Head normocephalic, atraumatic. There is no scleral icterus. Conjunctivae normal. Pupils equal, round,  and react to light. Mucous membranes: Mild dryness. No pharyngeal erythema.   NECK: Supple. No lymphadenopathy. No JVD. No carotid bruit. No thyromegaly.  CHEST: Has no focal tenderness.  LUNGS: Bilaterally clear to auscultation.  HEART: S1 and S2, regular. No murmurs are heard.  ABDOMEN: Bowel sounds present. Soft, nontender, nondistended. No hepatosplenomegaly.  EXTREMITIES: No pedal edema. Pulses 2+.  SKIN: No rash or lesions.  MUSCULOSKELETAL: Good range of motion in all the extremities.  NEUROLOGIC: No weakness or numbness in any part of the body. Cranial nerves II-XII intact. Motor 5/5 in upper and lower extremities. Gait slow, but normal.     LABORATORY DATA: Urinalysis negative for nitrites and leukocyte esterase. CBC, CMP are completely within normal limits. Urine drug screen is positive for opiates and benzodiazepines.  CT head without contrast: No acute intracranial abnormality.   ASSESSMENT AND PLAN:  [name] is Dana [name] 69 year old female who comes to the Emergency Department with frequent falls, slurred speech.  1.  Transient ischemic attack: The patient does state she had an MRI done about 2 months back; at that time showed 2 lacunar infarcts. Considering the patient's history of diabetes mellitus, obesity, hypertension, I will repeat the MRA of the brain. The slurred speech and wobbly gait are highly concerning from the polypharmacy.  Considering the patient's previous history of lacunar infarcts, we will further evaluate with carotid Dopplers and echocardiogram. The patient states has good improvement with the aspirin 325 mg daily. We will also obtain lipid profile.  2.  Diabetes mellitus: Continue with metformin and sliding scale insulin.  3.  Debility: Involve physical with physical therapy.  4.  Back pain: The patient follows up with orthopedic surgery. We will recommend the patient to follow up as an outpatient.  5.  Polypharmacy: Hold the sedative medications for now. Continue with Norco.  6.  Keep the patient on deep vein thrombosis prophylaxis with Lovenox.   TIME SPENT: 55 minutes.     ____________________________ Susa Griffins, MD pv:bm D: 04/21/2014 01:23:55 ET T: 04/21/2014 02:36:48 ET JOB#: [id number]  cc: Susa Griffins, MD, <Dictator> Jillene Bucks. Arlana Pouch, MD Clerance Lav Keziah Avis MD ELECTRONICALLY SIGNED 04/25/2014 22:10

## 2014-08-20 NOTE — Consult Note (Signed)
PATIENT NAME:  Dana [name], Dana [name] MR#:  [id number] DATE OF BIRTH:  March 01, 1946  DATE OF CONSULTATION:  06/21/2013  REFERRING PHYSICIAN:  Kathreen Devoid. Paduchowski, MD CONSULTING PHYSICIAN:  Susa Griffins, MD  CHIEF COMPLAINT: Altered mental status.   HISTORY OF PRESENT ILLNESS: Ms. Dana [name] is a 69 year old female with history of diabetes mellitus, hypertension, hyperlipidemia. She is brought to the Emergency Department with altered mental status. Per the patient, the patient received news that the patient's ex-husband died on 09-18-2022. When she heard this news on Wednesday, the patient was very upset concerning about her financial situation. The patient states since then, the patient has been wobbly, having difficulty focusing. Sister, who is at bedside, states that on Thursday when she saw her, she was extremely upset; however, the patient was not confused at that time. Today, when the patient's sister went to visit her, the patient was drowsy, unable to read, was walking extremely wobbly. Concerning this, EMS was called and was brought to the Emergency Department. Workup in the Emergency Department: The patient's urine drug screen is positive for benzos and opiates. The patient is also on tizanidine, gabapentin, Flexeril, in addition to Xanax 1 mg, takes every 4 to 6 hours. The patient was also found to have a urinary tract infection. Received 1 g of Rocephin in the Emergency Department.    PAST MEDICAL HISTORY:  1. Diabetes mellitus.  2. .  3. Chronic back pain.  4. Hyperlipidemia.  5. Depression.  6. Left knee replacement.   ALLERGIES:  1. AUGMENTIN.  2. ERYTHROMYCIN.   HOME MEDICATIONS:  1. Xanax 1 mg every 4 to 6 hours as needed.  2. Vitamin E 400 units once a day.  3. Tizanidine 4 mg 3 times a day.  4. Simvastatin 40 mg once a day.  5. Motrin 800 mg 2 times a day.  6. Metformin 500 mg 2 times a day.  7. Gabapentin 300 mg 3 times a day.  8. Flexeril 10 mg at bedtime.  9. Fish  oil 1 capsule once a day.  10. Cymbalta 60 mg once a day.  11. Aspirin 325 mg once a day.   SOCIAL HISTORY: Continues to smoke less than a pack a day. Denies drinking alcohol or using illicit drugs.   FAMILY HISTORY: Sister with CVA at the age of 80. Father with diabetes mellitus. Sister with vulvar cancer.   REVIEW OF SYSTEMS:  CONSTITUTIONAL: Generalized weakness.  EYES: No change in vision.  ENT: No change in hearing.  RESPIRATORY: No cough, shortness of breath.  CARDIOVASCULAR: No chest pain, palpations.  GASTROINTESTINAL: No nausea, vomiting. The patient states has had decreased appetite since the patient heard of her ex-husband's death.  GENITOURINARY: No dysuria or hematuria.  ENDOCRINE: Has a diagnosis of diabetes mellitus.  SKIN: No rash or lesions.  MUSCULOSKELETAL: Has chronic back pain.  NEUROLOGIC: The patient states has been wobbly.   PHYSICAL EXAMINATION:  GENERAL: This is a well-built, well-nourished, age-appropriate female lying down in the bed, not in distress.  VITAL SIGNS: Temperature 98, pulse 90, blood pressure 147/72, respiratory rate of 16, oxygen saturation 100% on room air.  HEENT: Head normocephalic, atraumatic. There is no scleral icterus. Conjunctivae normal. Pupils equal and react to light. Extraocular movements are intact. Mucous membranes moist. No pharyngeal erythema.  NECK: Supple. No lymphadenopathy. No JVD. No carotid bruit.  CHEST: Has no focal tenderness.  LUNGS: Bilaterally clear to auscultation.  HEART: S1, S2 regular. No murmurs are heard.  ABDOMEN: Bowel sounds  present. Soft, nontender, nondistended.  EXTREMITIES: No pedal edema. Pulses 2+.  NEUROLOGIC: The patient is alert, oriented to place, person and time. Cranial nerves II through XII intact. Motor 5/5 in upper and lower extremities.  SKIN: No rashes or lesions.  MUSCULOSKELETAL: Good range of motion in all of the extremities.   LABORATORIES: UA 3+ leukocyte esterase, WBC of 83. Urine  drug screen is positive for benzos and opiates. CBC: WBC of 12,000. No left shift. BMP is completely within normal limits.   CT head without contrast: No acute intracranial abnormality.   ASSESSMENT AND PLAN: Dana [name] is a 69 year old female who comes to the Emergency Department with altered mental status.  1. Altered mental status: The patient was quite altered per the Emergency Room physician at the time of Emergency Room presentation; however, the patient is currently well conscious, oriented. Most likely, this is from the medications, induced from polypharmacy. Urinary tract infection could have contributed; however, the patient does not have any symptoms at this time.  2. Urinary tract infection: Recommended the patient be treated on Rocephin; however, the patient wants to leave against medical advice. If the patient remains in the hospital, will continue with Rocephin.   TIME SPENT: 45 minutes.   ____________________________ Susa GriffinsPadmaja Journie Howson, MD pv:gb D: 06/21/2013 02:13:12 ET T: 06/21/2013 04:44:37 ET JOB#: [id number]  cc: Susa GriffinsPadmaja Mi Balla, MD, <Dictator> Susa GriffinsPADMAJA Jessly Lebeck MD ELECTRONICALLY SIGNED 07/04/2013 22:34

## 2014-08-24 NOTE — Discharge Summary (Signed)
PATIENT NAME:  Dana [name], Dana [name] MR#:  [id number] DATE OF BIRTH:  1946-02-18  DATE OF ADMISSION:  04/20/2014 DATE OF DISCHARGE:  04/21/2014  ADMITTING DIAGNOSIS: Transient ischemic attack.  DISCHARGE DIAGNOSES: 1.  Altered mental status of unclear etiology with slurred speech as well as unsteady gait as well as confusion. No stroke.  2.  Obstructive sleep apnea.  3.  Back pain, known degenerative disk disease.  4.  Diabetes mellitus, with hemoglobin A1c 8.0, type 2, poorly controlled.  5.  Elevated transaminases of unclear etiology.  6.  Ongoing tobacco abuse.  7.  Obesity.  8.  Hyperlipidemia.  9.  Depression.   DISCHARGE CONDITION: Stable.   DISCHARGE MEDICATIONS: 1.  The patient is to continue gabapentin 300 mg 3 times daily.  2.  Aspirin 325 mg p.o. daily.  3.  Cymbalta 60 mg p.o. at bedtime.  4.  Docusate sodium 100 mg twice daily as needed.  5.  Fish oil 1 tablet 200 mg p.o. twice daily.  6.  Metformin extended release 500 mg 2 tablets twice daily which would be 1000 mg twice daily.  7.  Simvastatin 40 mg p.o. at bedtime.  8.  Tizanidine 4 mg 3 times daily. 9.  Vitamin E 400 units once daily.  10.  Alprazolam 1 mg every 4 to 6 hours as needed.  11.  Norco 5/325 mg 1 tablet every 8 hours as needed for severe pain.  12.  Glipizide extended release 5 mg p.o. daily.  HOME OXYGEN: None.   DIET: 2 g salt, low-fat, low-cholesterol, diabetic diet, regular consistency.  ACTIVITY LIMITATIONS: As tolerated. The patient is being referred to sleep study as outpatient.  FOLLOWUP APPOINTMENTS: Dr. Mariah MillingGollan for echocardiogram in the next few days after discharge in his office. Also, sleep study, which she was recommended to get sleep study as soon as possible. Dr. Maree Krabbeate's office in 2 days after discharge. Dr. Laban EmperorNaveira in 2 days after discharge for pain management and further recommendations in regards to back pain issues.  CONSULTANTS: Care management, social work.   RADIOLOGIC  STUDIES: CT scan of head without contrast 04/20/2014 showed no acute intracranial abnormality. MRI of brain without contrast the 04/21/2014 revealed [name] stable examination, chronic changes, but no acute changes noted. Carotid ultrasound 04/21/2014 showed that she is predominantly hypoechoic and smooth atherosclerotic plaque in the bilateral proximal internal carotid arteries without evidence of stenosis. Vertebral arteries remain patent, with normal antegrade flow.  HISTORY OF PRESENT ILLNESS: The patient is [name] 69 year old Caucasian female who was brought to the Emergency Room because of back pain as well as slurred speech and frequent falls. Please refer to Dr. Clarita LeberVasireddy's admission note on 04/20/2014. According to the patient's family, the patient had intermittent wobbly walk as well as slurring speech for the past few months. She was brought to the Emergency Room because of back pains and admitted due to concerns of TIA.  VITAL SIGNS: On arrival to the hospital: Temperature was 97.7, pulse was 96, respiration rate was 20, blood pressure 138/92, Saturation was 94% on room air.   PHYSICAL EXAM: Unremarkable.  LABORATORY DATA: Done on arrival to the hospital: Showed elevation of the CO2 level of 33, glucose level of 144, otherwise BMP was unremarkable.  Alcohol level was less than 3. Liver enzymes showed elevation of AST to 87. Cardiac enzymes x 3 were normal. Urine drug screen was positive for benzodiazepines as well as opiates. Patient's CBC: White blood cell count was 11.9, hemoglobin was 12.7, platelet count  was 201,000. Urinalysis was unremarkable. EKG showed normal sinus rhythm at 82 beats per minute, normal axis, possible left atrial enlargement, R prime or QR pattern in V1 suggesting right ventricular conduction delay but no acute ST-T changes were noted. Chest x-ray was not performed.  HOSPITAL COURSE: The patient was admitted to the hospital for further evaluation. She underwent stroke evaluation.  She had MRI of her brain done, which showed no stroke. She also had [name] carotid ultrasound, which also did not show any significant abnormalities except [name] small smooth plaque. It was felt that the patient's altered mental status was of unclear etiology at this point. However, due to patient's elevation of CO2 level, obstructive sleep apnea was implicated in her condition. The patient did have ABGs done while she was in the hospital, and, ABGs revealed pH of 7.43, pCO2 was 40, pO2 was 71. Saturation was 94.5% on 21% FiO2. Patient's lactic acid level was found to be elevated at 3.2. Since the patient's ABGs were unremarkable, no further interventions were performed while she was in the hospital. However, the patient was advised to get [name] sleep study as an outpatient.  In regards to back pains and known history of degenerative disk disease, the patient was advised to follow up with Dr. Laban Emperor for further recommendations. No further studies were performed due to recent evaluation, according to patient, with MRI, results of which are not available at this time on the hospital computer.   Regarding diabetes mellitus, patient's hemoglobin A1c was checked and was found to be markedly elevated at 8.0 and glipizide was initiated. It is recommended to follow the patient's hemoglobin A1c and make decisions about advancement of her diabetic medications with the goal to keep her hemoglobin A1c below 7 if possible. In regard to elevated transaminases, the reason for elevated transaminases remained unclear. However, fatty liver was implicated, since the patient was overall asymptomatic. It is recommended to follow the patient's liver function tests as outpatient. Her liver tests were repeated on 04/21/2014, and the patient's AST was improved at 61.  Regarding tobacco abuse, the patient was counseled and recommended replacement therapy. For obesity, the patient was recommended to get obstructive sleep apnea evaluation. For  hyperlipidemia, the patient is to continue her outpatient medications. No other changes were made. However, the patient was advised to get her lipid panel checked as outpatient by her primary care physician. For depression, the patient is to continue Cymbalta. The patient is being discharged in stable condition with the above-mentioned medications and followup. On the day of discharge, temperature was 97.8, pulse was 86, respiration rate was 18, blood pressure 159/84, saturation was 97% on room air at rest.   TIME SPENT: 40 minutes.    ____________________________ Katharina Caper, MD rv:ST D: 04/21/2014 18:28:48 ET T: 04/21/2014 22:34:21 ET JOB#: [id number]  cc: Katharina Caper, MD, <Dictator> Antonieta Iba, MD Jillene Bucks Arlana Pouch, MD Francisco [name]. Laban Emperor, MD Katharina Caper MD ELECTRONICALLY SIGNED 05/11/2014 21:02

## 2014-08-28 NOTE — Discharge Summary (Signed)
PATIENT NAME:  Dana [name], Dana [name] MR#:  [id number] DATE OF BIRTH:  [birth year]  DATE OF ADMISSION:  05/16/2014 DATE OF DISCHARGE:  05/19/2014  PRIMARY CARE PHYSICIAN: Dana Oatsenny Tate, MD   DISCHARGE DIAGNOSES:  1.  Left upper lobe pneumonia with sepsis and acute encephalopathy, due to sepsis.  2.  Tobacco abuse.  3.  Chronic back pain.  4.  Diabetes.  5.  Obesity.   CONDITION: Stable.   CODE STATUS: FULL CODE.   HOME MEDICATIONS: Please refer to the medication reconciliation list.   DIET: Low-sodium, low-fat, low-cholesterol, ADA diet.   ACTIVITY: As tolerated.   FOLLOW-UP CARE: Follow with PCP within 1 to 2 weeks.  The patient needs home health and physical therapy.   REASON FOR ADMISSION: Confusion.   HOSPITAL COURSE: The patient is [name] 69 year old Caucasian female with [name] history of chronic back pain, diabetes, tobacco abuse presents in the ED with confusion.  For detailed history and physical examination, please refer to the admission note dictated by Dr. Elpidio [name]. On admission date, laboratory data showed BUN 6, creatinine 0.96. WBC 15, hemoglobin 12.5, urinalysis is negative. Chest x-ray showed [name] left upper lobe opacity with pneumonia, possible mass.  1.  Left upper lobe pneumonia with severe sepsis and acute encephalopathy. After admission, the patient has been treated with Zithromax and Rocephin IV.  Blood culture is negative.  [name] CT angio showed infiltrate in the left upper lobe, suspect pneumonia.  The patient's acute encephalopathy improved after treatment with antibiotics. The patient is alert, awake, oriented but has generalized weakness. According to physical therapy evaluation, the patient needs subacute rehabilitation placement, but the patient does not wants to go to rehabilitation.  PT re-evaluation suggested home health and physical therapy.  2.  Diabetes has been treated with sliding scale.  3.  Tobacco abuse. The patient was counseled for smoking cessation and we will  continue nicotine patch after discharge.   The patient's symptoms have much improved. She has no complaints. Vital signs are stable. She is clinically stable and will be discharged to home with home health and physical therapy today. I discussed the patient's discharge plan with the patient, nurse, case manager.   TIME SPENT: About 38 minutes.     ____________________________ Dana PollackQing Kinley Dozier, MD qc:DT D: 05/19/2014 14:15:21 ET T: 05/19/2014 15:00:15 ET JOB#: [id number]  cc: Dana PollackQing Roshawna Colclasure, MD, <Dictator> Dana PollackQING Maria Coin MD ELECTRONICALLY SIGNED 05/19/2014 16:19

## 2014-08-28 NOTE — H&P (Signed)
PATIENT NAME:  Dana [name], Dana [name] MR#:  [id number] DATE OF BIRTH:  26-Sep-1945  DATE OF ADMISSION:  05/16/2014  PRIMARY CARE PROVIDER: Jillene Bucks. Arlana Pouch, MD   CHIEF COMPLAINT: Confusion.  HISTORY OF PRESENT ILLNESS: This is a 69 year old Caucasian female patient with history of diabetes, chronic back pain, depression and tobacco abuse, who presents to the Emergency Room, brought in by family with confusion. The patient was previously seen in December 2015 for episodes of slurred speech, frequent falls, and confusion. She had an MRI of the brain and everything checked out okay. She was diagnosed with sleep apnea due to elevated pCO2, which was thought to be the possible cause. The patient has had recurrent falls since discharge. Also sister at bedside mentions that she has episodes of confusion, which tenderness to resolve quickly.   The patient is unable to contribute to history, due to confusion.   REVIEW OF SYSTEMS: Unobtainable, secondary to encephalopathy.   PAST MEDICAL HISTORY:  1.  Diabetes mellitus.  2.  Chronic back pain.  3.  Hyperlipidemia.  4.  Depression.  5.  Left knee replacement.  6.  Possible obstructive sleep apnea.  7.  Obesity.  8.  Tobacco abuse.   ALLERGIES: AUGMENTIN AND ERYTHROMYCIN.   SOCIAL HISTORY: The patient continues to smoke 1/2 pack a day. Does not drink any alcohol. No illicit drug use. Lives with her sister.   FAMILY HISTORY: Sister had CVA. Father with diabetes mellitus. Also liver cancer in the family.   HOME MEDICATIONS: List is not available, but in looking at her recent discharge summary, the patient was discharged on:  1.  Alprazolam 1 mg oral every 4-6 hours as needed.  2.  Aspirin 325 mg daily.  3.  Cymbalta 60 mg.  4.  Docusate 100 mg 2 times a day as needed.  6.  Levaquin 300 mg oral 3 times a day.  7.  Glipizide 5 mg oral once a day.  8.  Metformin 500 mg 2 tablets once a day in the morning and 2 at night.  9.  Simvastatin 40 mg daily.   10.  Tizanidine 4 mg oral 3 times a day.  11.  Vitamin E 400 international units 1 capsule oral once a day.  12.   Acetaminophen/hydrocodone 325/5 mg 1 tablet every 8 hours as needed for pain.   PHYSICAL EXAMINATION:  VITAL SIGNS: Temperature 101.2, pulse of 110, respirations 22, blood pressure 125/75, saturating 96% on room air.  GENERAL: Obese Caucasian female patient lying in bed, pleasantly confused.  PSYCHIATRIC: Alert, awake, but not oriented.  HEENT: Atraumatic, normocephalic. Oral mucosa dry and pink. External ears and nose normal. No pallor or icterus. Pupils bilaterally equal and reactive to light.  NECK: Supple. No thyromegaly. No palpable lymph nodes. Trachea midline. No carotid bruit or JVD.  CARDIOVASCULAR: S1, S2. Tachycardic without any murmurs. Peripheral pulses 2+. No edema.  RESPIRATORY: Increased work of breathing and clear to auscultation on both sides.  GASTROINTESTINAL: Soft abdomen, nontender. Bowel sounds present. No organomegaly palpable.  SKIN: Warm and dry. No petechiae, rash, or ulcers.  MUSCULOSKELETAL: No joint swelling, redness, or effusion of the large joints. Normal muscle tone.  NEUROLOGICAL: Motor strength 5/5 in upper and lower extremities. Sensation to fine touch intact all over.  LYMPHATIC: No cervical lymphadenopathy.   LABORATORY STUDIES: Glucose 222, BUN 6, creatinine 0.96, sodium 137, potassium 3.2, chloride 100, bicarbonate 28 GFR greater than 60. AST, ALT, alkaline phosphatase, bilirubin normal. Troponin less than 0.02.  WBC 15, hemoglobin 12.5, and platelets 190,000.   Urinalysis shows no bacteria.   Lactic acid 4.7.   EKG shows sinus tachycardia.   Chest x-ray shows left upper lobe opacity with pneumonia; possible mass. CT recommended.   ASSESSMENT AND PLAN:  1.  Left upper lobe pneumonia with severe sepsis and acute encephalopathy. The patient will be started on IV azithromycin and ceftriaxone. Send for blood and sputum cultures. Order  IV bolus for her severe sepsis. Monitor her urine output closely. The patient will be placed on fall precautions and further management as per her culture results. We will also get a CT scan of the chest with contrast, as this left upper lobe infiltrate seems to also have a mass along with her tobacco abuse history.  2.  Diabetes mellitus. Continue her home medications along with sliding scale insulin.  3.  Chronic back pain. The patient is on acetaminophen/hydrocodone at home. We will continue this medication as needed.  4.  Deep vein thrombosis prophylaxis with Lovenox.   CODE STATUS: Full Code.   TIME SPENT TODAY ON THIS CASE: 45 minutes.    ____________________________ Molinda BailiffSrikar R. Lieutenant Abarca, MD srs:MT D: 05/16/2014 11:05:04 ET T: 05/16/2014 11:56:53 ET JOB#: [id number]  cc: Wardell HeathSrikar R. Keymani Glynn, MD, <Dictator> Jillene Bucksenny C. Arlana Pouchate, MD Orie FishermanSRIKAR R Kasten Leveque MD ELECTRONICALLY SIGNED 05/16/2014 16:51

## 2015-06-09 DIAGNOSIS — E119 Type 2 diabetes mellitus without complications: Secondary | ICD-10-CM | POA: Diagnosis not present

## 2015-06-09 DIAGNOSIS — E785 Hyperlipidemia, unspecified: Secondary | ICD-10-CM | POA: Diagnosis not present

## 2015-06-14 DIAGNOSIS — E119 Type 2 diabetes mellitus without complications: Secondary | ICD-10-CM | POA: Diagnosis not present

## 2015-06-14 DIAGNOSIS — E785 Hyperlipidemia, unspecified: Secondary | ICD-10-CM | POA: Diagnosis not present

## 2015-06-15 DIAGNOSIS — Z87898 Personal history of other specified conditions: Secondary | ICD-10-CM | POA: Diagnosis not present

## 2015-06-15 DIAGNOSIS — M5136 Other intervertebral disc degeneration, lumbar region: Secondary | ICD-10-CM | POA: Diagnosis not present

## 2015-06-15 DIAGNOSIS — R0789 Other chest pain: Secondary | ICD-10-CM | POA: Diagnosis not present

## 2015-06-15 DIAGNOSIS — R000 Tachycardia, unspecified: Secondary | ICD-10-CM | POA: Diagnosis not present

## 2015-06-15 DIAGNOSIS — I509 Heart failure, unspecified: Secondary | ICD-10-CM | POA: Diagnosis not present

## 2015-06-27 DIAGNOSIS — R0789 Other chest pain: Secondary | ICD-10-CM | POA: Diagnosis not present

## 2015-06-29 DIAGNOSIS — R000 Tachycardia, unspecified: Secondary | ICD-10-CM | POA: Diagnosis not present

## 2015-07-04 DIAGNOSIS — I5030 Unspecified diastolic (congestive) heart failure: Secondary | ICD-10-CM | POA: Diagnosis not present

## 2015-07-04 DIAGNOSIS — M5136 Other intervertebral disc degeneration, lumbar region: Secondary | ICD-10-CM | POA: Diagnosis not present

## 2015-07-10 ENCOUNTER — Emergency Department: Payer: PPO

## 2015-07-10 ENCOUNTER — Emergency Department
Admission: EM | Admit: 2015-07-10 | Discharge: 2015-07-10 | Disposition: A | Discharge status: Home / Self Care | Payer: PPO | Attending: Emergency Medicine | Admitting: Emergency Medicine

## 2015-07-10 DIAGNOSIS — E119 Type 2 diabetes mellitus without complications: Secondary | ICD-10-CM | POA: Diagnosis not present

## 2015-07-10 DIAGNOSIS — Y9289 Other specified places as the place of occurrence of the external cause: Secondary | ICD-10-CM | POA: Insufficient documentation

## 2015-07-10 DIAGNOSIS — R000 Tachycardia, unspecified: Secondary | ICD-10-CM | POA: Diagnosis not present

## 2015-07-10 DIAGNOSIS — S8992XA Unspecified injury of left lower leg, initial encounter: Secondary | ICD-10-CM | POA: Insufficient documentation

## 2015-07-10 DIAGNOSIS — Y998 Other external cause status: Secondary | ICD-10-CM | POA: Diagnosis not present

## 2015-07-10 DIAGNOSIS — R42 Dizziness and giddiness: Secondary | ICD-10-CM | POA: Diagnosis not present

## 2015-07-10 DIAGNOSIS — Y9389 Activity, other specified: Secondary | ICD-10-CM | POA: Insufficient documentation

## 2015-07-10 DIAGNOSIS — W1839XA Other fall on same level, initial encounter: Secondary | ICD-10-CM | POA: Insufficient documentation

## 2015-07-10 DIAGNOSIS — M25462 Effusion, left knee: Secondary | ICD-10-CM | POA: Diagnosis not present

## 2015-07-10 DIAGNOSIS — R55 Syncope and collapse: Secondary | ICD-10-CM | POA: Diagnosis not present

## 2015-07-10 HISTORY — DX: Type 2 diabetes mellitus without complications: E11.9

## 2015-07-10 LAB — URINALYSIS COMPLETE WITH MICROSCOPIC (ARMC ONLY)
BACTERIA UA: NONE SEEN
Bilirubin Urine: NEGATIVE
Glucose, UA: NEGATIVE mg/dL
Hgb urine dipstick: NEGATIVE
Ketones, ur: NEGATIVE mg/dL
NITRITE: NEGATIVE
PH: 5 (ref 5.0–8.0)
PROTEIN: NEGATIVE mg/dL
SPECIFIC GRAVITY, URINE: 1.029 (ref 1.005–1.030)

## 2015-07-10 LAB — CBC WITH DIFFERENTIAL/PLATELET
BASOS ABS: 0.1 10*3/uL (ref 0–0.1)
BASOS PCT: 0 %
EOS ABS: 0.8 10*3/uL — AB (ref 0–0.7)
EOS PCT: 5 %
HCT: 37.4 % (ref 35.0–47.0)
Hemoglobin: 12.2 g/dL (ref 12.0–16.0)
Lymphocytes Relative: 18 %
Lymphs Abs: 2.9 10*3/uL (ref 1.0–3.6)
MCH: 29.9 pg (ref 26.0–34.0)
MCHC: 32.7 g/dL (ref 32.0–36.0)
MCV: 91.7 fL (ref 80.0–100.0)
Monocytes Absolute: 0.5 10*3/uL (ref 0.2–0.9)
Monocytes Relative: 3 %
NEUTROS PCT: 74 %
Neutro Abs: 11.9 10*3/uL — ABNORMAL HIGH (ref 1.4–6.5)
PLATELETS: 291 10*3/uL (ref 150–440)
RBC: 4.07 MIL/uL (ref 3.80–5.20)
RDW: 14.3 % (ref 11.5–14.5)
WBC: 16.2 10*3/uL — AB (ref 3.6–11.0)

## 2015-07-10 LAB — BASIC METABOLIC PANEL
Anion gap: 10 (ref 5–15)
BUN: 15 mg/dL (ref 6–20)
CO2: 25 mmol/L (ref 22–32)
CREATININE: 1.03 mg/dL — AB (ref 0.44–1.00)
Calcium: 9.3 mg/dL (ref 8.9–10.3)
Chloride: 101 mmol/L (ref 101–111)
GFR, EST NON AFRICAN AMERICAN: 55 mL/min — AB (ref >60)
Glucose, Bld: 205 mg/dL — ABNORMAL HIGH (ref 65–99)
Potassium: 4.1 mmol/L (ref 3.5–5.1)
SODIUM: 136 mmol/L (ref 135–145)

## 2015-07-10 LAB — TROPONIN I: Troponin I: <0.03 ng/mL (ref <0.031)

## 2015-07-10 LAB — FIBRIN DERIVATIVES D-DIMER (ARMC ONLY): Fibrin derivatives D-dimer (ARMC): 776 — ABNORMAL HIGH (ref 0–499)

## 2015-07-10 MED ORDER — SODIUM CHLORIDE 0.9 % IV BOLUS (SEPSIS)
500.0000 mL | Freq: Once | INTRAVENOUS | Status: AC
  Administered 2015-07-10: 500 mL via INTRAVENOUS

## 2015-07-10 MED ORDER — IOHEXOL 350 MG/ML SOLN
75.0000 mL | Freq: Once | INTRAVENOUS | Status: AC | PRN
  Administered 2015-07-10: 75 mL via INTRAVENOUS

## 2015-07-10 MED ORDER — KETOROLAC TROMETHAMINE 30 MG/ML IJ SOLN
15.0000 mg | Freq: Once | INTRAMUSCULAR | Status: AC
  Administered 2015-07-10: 15 mg via INTRAVENOUS
  Filled 2015-07-10: qty 1

## 2015-07-10 NOTE — Discharge Instructions (Signed)

## 2015-07-10 NOTE — ED Notes (Signed)
Pt reports she fell 2 times last night and once this am - Pt reports having no memory of the fall this morning but grand daughter was there and caught her and the pt had no memory of how she fell - Pt denies being sick or having nausea or vomiting - Grand daughter states that all three falls were after the pt had stood up and walked a few steps - Pt reports that since the first fall her left knee (which she had a partial knee replacement in 14 years ago) has been hurting - Pt had cardiac work up last week for elevated heart rate

## 2015-07-10 NOTE — ED Notes (Signed)
Pt notified of need for urine sample and cup of water given to pt

## 2015-07-10 NOTE — ED Provider Notes (Signed)
Time Seen: Approximately *1350  I have reviewed the triage notes  Chief Complaint: Fall   History of Present Illness: Dana [name] is a 70 y.o. female who states that she's had a couple falls recently and felt like her knee was giving albuterol further questioning this seemed to be some associated feelings of lightheadedness. Patient states that she fell again today and has had some knee pain. She's had previous surgery on her left knee. Patient does not remember the fall and states that she "" and may have passed out "". Bystander states that the episodes seem to occur when the patient got up to walk around. He was noted on arrival that she's hypotensive. Denies any nausea, vomiting. She denies any new medications. The patient states she was recently evaluated by a cardiologist and had treadmill test etc. and did well and did not appear to have any signs of ischemic heart disease. She states that her cardiologist noted that her heart rate is elevated of unknown cause.  Past Medical History  Diagnosis Date  . Diabetes mellitus without complication (HCC)     There are no active problems to display for this patient.   History reviewed. No pertinent past surgical history.  History reviewed. No pertinent past surgical history.  No current outpatient prescriptions on file.  Allergies:  Erythromycin  Family History: No family history on file.  Social History: Social History  Substance Use Topics  . Smoking status: Never Smoker   . Smokeless tobacco: None  . Alcohol Use: None     Review of Systems:   10 point review of systems was performed and was otherwise negative:  Constitutional: No fever Eyes: No visual disturbances ENT: No sore throat, ear pain Cardiac: No chest pain Respiratory: No shortness of breath, wheezing, or stridor Abdomen: No abdominal pain, no vomiting, No diarrhea Endocrine: No weight loss, No night sweats Extremities: Patient only complains  of left knee pain Skin: No rashes, easy bruising Neurologic: No focal weakness, trouble with speech or swollowing Urologic: No dysuria, Hematuria, or urinary frequency   Physical Exam:  ED Triage Vitals  Enc Vitals Group     BP 07/10/15 1255 87/61 mmHg     Pulse Rate 07/10/15 1255 93     Resp 07/10/15 1255 18     Temp 07/10/15 1255 97.8 F (36.6 C)     Temp Source 07/10/15 1255 Oral     SpO2 07/10/15 1255 93 %     Weight 07/10/15 1255 180 lb (81.647 kg)     Height 07/10/15 1255 5\' 6"  (1.676 m)     Head Cir --      Peak Flow --      Pain Score 07/10/15 1256 8     Pain Loc --      Pain Edu? --      Excl. in GC? --     General: Awake , Alert , and Oriented times 3; GCS 15 Head: Normal cephalic , atraumatic Eyes: Pupils equal , round, reactive to light Nose/Throat: No nasal drainage, patent upper airway without erythema or exudate.  Neck: Supple, Full range of motion, No anterior adenopathy or palpable thyroid masses Lungs: Clear to ascultation without wheezes , rhonchi, or rales Heart: Regular rate, regular rhythm without murmurs , gallops , or rubs Abdomen: Soft, non tender without rebound, guarding , or rigidity; bowel sounds positive and symmetric in all 4 quadrants. No organomegaly .        Extremities: 2 plus  symmetric pulses. No edema, clubbing or cyanosis Neurologic: normal ambulation, Motor symmetric without deficits, sensory intact Skin: warm, dry, no rashes   Labs:   All laboratory work was reviewed including any pertinent negatives or positives listed below:  Labs Reviewed  BASIC METABOLIC PANEL - Abnormal; Notable for the following:    Glucose, Bld 205 (*)    Creatinine, Ser 1.03 (*)    GFR calc non Af Amer 55 (*)    All other components within normal limits  CBC WITH DIFFERENTIAL/PLATELET - Abnormal; Notable for the following:    WBC 16.2 (*)    Neutro Abs 11.9 (*)    Eosinophils Absolute 0.8 (*)    All other components within normal limits  FIBRIN  DERIVATIVES D-DIMER ([facility] ONLY) - Abnormal; Notable for the following:    Fibrin derivatives D-dimer ([facility]) 776 (*)    All other components within normal limits  URINALYSIS COMPLETEWITH MICROSCOPIC ([facility] ONLY) - Abnormal; Notable for the following:    Color, Urine YELLOW (*)    APPearance CLEAR (*)    Leukocytes, UA TRACE (*)    Squamous Epithelial / LPF 0-5 (*)    All other components within normal limits  TROPONIN I   reviewed the patient's laboratory work showed an elevated D-dimer test and an elevated white blood cell count  EKG: * ED ECG REPORT I, Jennye Moccasin, the attending physician, personally viewed and interpreted this ECG.  Date: 07/10/2015 EKG Time: 1253 Rate: 95 Rhythm: normal sinus rhythm QRS Axis: normal Intervals: Right ventricular delay ST/T Wave abnormalities: Nonspecific diffuse T-wave abnormalities Conduction Disturbances: none Narrative Interpretation: unremarkable No acute ischemic changes noted   Radiology:   cardiac workup for tachycardia, diabetes mellitus  EXAM: CT ANGIOGRAPHY CHEST WITH CONTRAST  TECHNIQUE: Multidetector CT imaging of the chest was performed using the standard protocol during bolus administration of intravenous contrast. Multiplanar CT image reconstructions and MIPs were obtained to evaluate the vascular anatomy.  CONTRAST: 75mL OMNIPAQUE IOHEXOL 350 MG/ML SOLN IV  COMPARISON: 05/16/2014 CT chest  FINDINGS: Atherosclerotic calcifications aorta and coronary arteries.  Aorta normal caliber without aneurysm or dissection.  Pulmonary arteries well opacified and patent.  No evidence of pulmonary embolism.  Scattered normal sized mediastinal lymph nodes identified.  Visualized upper abdomen unremarkable.  Resolution of previously identified LEFT upper lobe pneumonia.  Scattered peribronchial thickening.  No acute infiltrate, pleural effusion or pneumothorax.  Numerous tiny pulmonary nodular foci, greatest in  RIGHT upper lobe, many stable since previous exam though not all of these word definitely visualized on the previous study.  No acute osseous findings.  Review of the MIP images confirms the above findings.  IMPRESSION: No evidence of pulmonary embolism.  Resolution of previously identified LEFT upper lobe pneumonia.  Scattered tiny nodules predominately RIGHT lung, recommendation below.  If the patient is at high risk for bronchogenic carcinoma, follow-up chest CT at 1 year is recommended. If the patient is at low risk, no follow-up is needed. This recommendation follows the consensus statement: Guidelines for Management of Small Pulmonary Nodules Detected on CT Scans: A Statement from the Fleischner Society as published in Radiology 2005; 237:395-400.   Electronically Signed By: Ulyses Southward M.D. On: 07/10/2015 16:25          DG Knee 2 Views Left (Final result) Result time: 07/10/15 13:51:45   Final result by Rad Results In Interface (07/10/15 13:51:45)   Narrative:   CLINICAL DATA: Left knee pain after 3 falls since last night.  EXAM: LEFT  KNEE - 1-2 VIEW  COMPARISON: None.  FINDINGS: The patient has had medial hemiarthroplasty. There is no acute fracture. No dislocation. There is a small joint effusion. Slight arthritis of patellofemoral compartment.  IMPRESSION: Joint effusion. No acute osseous abnormality.   Electronically Signed By: Francene Boyers M.D. On: 07/10/2015 13:51           I personally reviewed the radiologic studies    ED Course:  The patient had an extensive workup here in emergency department with no obvious source for her syncopal episodes other than some hypotension. The patient received an IV fluid bolus and her blood pressure responded and she may just simply have some dehydration. Her syncope does not appear to be cardiogenic in nature. Investigation of her left knee with pain shows that her knee is stable and  has some mild joint effusion but otherwise I don't suspect this is a septic joint. Patient appears to be of understanding states she has a heavier brace at home. She's been advised to get out of bed slowly and drink plenty of fluids and to contact her primary physician. She also has an orthopedic surgeon to a previous left knee surgery    Assessment: * Syncope Possibly vasovagal Final Clinical Impression: *  Final diagnoses:  Syncope     Plan:  Outpatient management Patient was advised to return immediately if condition worsens. Patient was advised to follow up with their primary care physician or other specialized physicians involved in their outpatient care            Jennye Moccasin, MD 07/10/15 2136

## 2015-07-10 NOTE — ED Notes (Signed)
Left knee gave out 2 times yesterday - Pt has little memory of the fall - today is having hypotensive episodes - 60-65/40

## 2015-07-26 DIAGNOSIS — E785 Hyperlipidemia, unspecified: Secondary | ICD-10-CM | POA: Diagnosis not present

## 2015-07-26 DIAGNOSIS — E119 Type 2 diabetes mellitus without complications: Secondary | ICD-10-CM | POA: Diagnosis not present

## 2015-07-26 DIAGNOSIS — Z048 Encounter for examination and observation for other specified reasons: Secondary | ICD-10-CM | POA: Diagnosis not present

## 2015-08-24 DIAGNOSIS — M25562 Pain in left knee: Secondary | ICD-10-CM | POA: Insufficient documentation

## 2015-08-24 DIAGNOSIS — Z96652 Presence of left artificial knee joint: Secondary | ICD-10-CM | POA: Diagnosis not present

## 2015-10-16 DIAGNOSIS — R200 Anesthesia of skin: Secondary | ICD-10-CM | POA: Diagnosis not present

## 2015-10-16 DIAGNOSIS — R202 Paresthesia of skin: Secondary | ICD-10-CM | POA: Diagnosis not present

## 2015-10-16 DIAGNOSIS — M67431 Ganglion, right wrist: Secondary | ICD-10-CM | POA: Diagnosis not present

## 2015-10-25 DIAGNOSIS — E785 Hyperlipidemia, unspecified: Secondary | ICD-10-CM | POA: Diagnosis not present

## 2015-10-25 DIAGNOSIS — E119 Type 2 diabetes mellitus without complications: Secondary | ICD-10-CM | POA: Diagnosis not present

## 2015-11-08 DIAGNOSIS — E119 Type 2 diabetes mellitus without complications: Secondary | ICD-10-CM | POA: Diagnosis not present

## 2015-11-08 DIAGNOSIS — E785 Hyperlipidemia, unspecified: Secondary | ICD-10-CM | POA: Diagnosis not present

## 2015-11-22 DIAGNOSIS — G5621 Lesion of ulnar nerve, right upper limb: Secondary | ICD-10-CM | POA: Diagnosis not present

## 2015-11-22 DIAGNOSIS — G5601 Carpal tunnel syndrome, right upper limb: Secondary | ICD-10-CM | POA: Diagnosis not present

## 2015-12-25 DIAGNOSIS — G5621 Lesion of ulnar nerve, right upper limb: Secondary | ICD-10-CM | POA: Diagnosis not present

## 2015-12-25 DIAGNOSIS — G5601 Carpal tunnel syndrome, right upper limb: Secondary | ICD-10-CM | POA: Diagnosis not present

## 2016-01-11 ENCOUNTER — Encounter
Admission: RE | Admit: 2016-01-11 | Discharge: 2016-01-11 | Disposition: A | Discharge status: Home / Self Care | Payer: PPO | Source: Ambulatory Visit | Attending: Orthopedic Surgery | Admitting: Orthopedic Surgery

## 2016-01-11 HISTORY — DX: Unspecified place in unspecified non-institutional (private) residence as the place of occurrence of the external cause: W19.XXXA

## 2016-01-11 HISTORY — DX: Unspecified place in unspecified non-institutional (private) residence as the place of occurrence of the external cause: Y92.009

## 2016-01-11 NOTE — Patient Instructions (Signed)
  Your procedure is scheduled on: 01-18-16 [facility](THURSDAY) Report to Same Day Surgery 2nd floor medical mall To find out your arrival time please call [phone] between 1PM - 3PM on 01-17-16 [facility](WEDNESDAY)  Remember: Instructions that are not followed completely may result in serious medical risk, up to and including death, or upon the discretion of your surgeon and anesthesiologist your surgery may need to be rescheduled.    _x___ 1. Do not eat food or drink liquids after midnight. No gum chewing or hard candies.     __x__ 2. No Alcohol for 24 hours before or after surgery.   __x__3. No Smoking for 24 prior to surgery.   ____  4. Bring all medications with you on the day of surgery if instructed.    __x__ 5. Notify your doctor if there is any change in your medical condition     (cold, fever, infections).     Do not wear jewelry, make-up, hairpins, clips or nail polish.  Do not wear lotions, powders, or perfumes. You may wear deodorant.  Do not shave 48 hours prior to surgery. Men may shave face and neck.  Do not bring valuables to the hospital.    [facility] for any belongings or valuables.               Contacts, dentures or bridgework may not be worn into surgery.  Leave your suitcase in the car. After surgery it may be brought to your room.  For patients admitted to the hospital, discharge time is determined by your treatment team.   Patients discharged the day of surgery will not be allowed to drive home.    Please read over the following fact sheets that you were given:   [facility] Preparing for Surgery and or MRSA Information   ____ Take these medicines the morning of surgery with A SIP OF WATER:    1. NONE  2.  3.  4.  5.  6.  ____ Fleet Enema (as directed)   ____ Use CHG Soap or sage wipes as directed on instruction sheet   ____ Use inhalers on the day of surgery and bring to hospital day of surgery  _X___ Stop metformin 2 days prior to  surgery-LAST DOSE ON Monday, September 18TH    ____ Take 1/2 of usual insulin dose the night before surgery and none on the morning of surgery.   ____ Stop aspirin or coumadin, or plavix  _x__ Stop Anti-inflammatories such as Advil, Aleve, Ibuprofen, Motrin, Naproxen,          Naprosyn, Goodies powders or aspirin products. Ok to take Tylenol.   _X___ Stop supplements until after surgery-STOP CRANBERRY NOW  ____ Bring C-Pap to the hospital.

## 2016-01-11 NOTE — Pre-Procedure Instructions (Signed)
Ech[facility] stress [facility]28/2017 [facility] Component Name Value Ref Range  LV Ejection Fraction (%) 55   Aortic Valve Regurgitation Grade mild   Aortic Valve Stenosis Grade none   Mitral Valve Regurgitation Grade mild   Mitral Valve Stenosis Grade none   Tricuspid Valve Regurgitation Grade none   Result Narrative   CARDIOLOGY DEPARTMENT Ernestina PennaBRILLHART, [facility] [id number] A [facility] Acct #: [id number] [address], [place], [place] [id number] Date: 06/27/2015 03:08 PM  Adult Female Age: [age] yrs  ECHOCARDIOGRAM REPORT Outpatient  KC::KCWC STUDY:Stress EchoTAPE: MD1:FATH, KENNETH ALAN  ECHO:Yes DOPPLER:YesFILE:0000-000-000 BP: 177/100 mmHg COLOR:YesCONTRAST:NoMACHINE:PhilipsHeight: 66 in RV BIOPSY:No 3D:No SOUND QLTY:Moderate Weight: 180 lb  MEDIUM:None BSA: 1.9 m2  ___________________________________________________________________________________________ HISTORY:Chest pain  REASON:Assess, LV function  Indication:R07.89 Other chest pain  ___________________________________________________________________________________________ STRESS ECHOCARDIOGRAPHY   Protocol:Treadmill (Bruce) Drugs:None Target Heart Rate:129 bpmMaximum Predicted Heart Rate: 152 bpm  +-------------------+-------------------------+-------------------------+------------+   Stage  Duration (mm:ss) Heart Rate (bpm) BP  +-------------------+-------------------------+-------------------------+------------+ RESTING 95 177/100  +-------------------+-------------------------+-------------------------+------------+ EXERCISE  5:00 169  / +-------------------+-------------------------+-------------------------+------------+ RECOVERY  3:18 110  198/92  +-------------------+-------------------------+-------------------------+------------+  Stress Duration:5:00 mm:ss Max Stress H.R.:169 bpmTarget Heart Rate Achieved: Yes   ___________________________________________________________________________________________ WALL SEGMENT CHANGES  RestStress Anterior Septum Basal:NormalHyperkinetic WGN:[id number]:NormalHyperkinetic  Apical:NormalHyperkinetic  Anterior Wall Basal:NormalHyperkinetic MWU:XLKGMWNUUVOZDGUYQIid:NormalHyperkinetic  Apical:NormalHyperkinetic   Lateral Wall Basal:NormalHyperkinetic HKV:QQVZDGLOVFIEPPIRJJid:NormalHyperkinetic  Apical:NormalHyperkinetic   Posterior Wall Basal:NormalHyperkinetic OAC:ZYSAYTKZSWFUXNATFTid:NormalHyperkinetic  Inferior Wall Basal:NormalHyperkinetic DDU:KGURKYHCWCBJSEGBTDid:NormalHyperkinetic  Apical:NormalHyperkinetic  Inferior Septum Basal:NormalHyperkinetic VVO:HYWVPXTGGYIRSWNIOEid:NormalHyperkinetic   Resting EF:>55% (Est.) Stress EF: >55% (Est.)   ___________________________________________________________________________________________ ADDITIONAL  FINDINGS    ___________________________________________________________________________________________ STRESS ECG RESULTS   ECG Results:Normal  ___________________________________________________________________________________________  ECHOCARDIOGRAPHIC DESCRIPTIONS  LEFT VENTRICLE Size:Normal  Contraction:Normal  LV Masses:No Masses  VOJ:JKKXLVH:None Dias.FxClass:(Grade 1) relaxation abnormal, E/A reversal  RIGHT VENTRICLE Size:Normal Free Wall:Normal  Contraction:Normal RV Masses:No mass  PERICARDIUM  Fluid:No effusion  _______________________________________________________________________________________  DOPPLER ECHO and OTHER SPECIAL PROCEDURES  Aortic:MILD ARNo AS   Mitral:MILD MRNo MS MV Inflow E Vel=nm*MV Annulus E'Vel=nm* E/E'Ratio=nm*  Tricuspid:No TRNo TS  Pulmonary:TRIVIAL PR No PS   ___________________________________________________________________________________________  ECHOCARDIOGRAPHIC MEASUREMENTS 2D DIMENSIONS AORTA ValuesNormal RangeMAIN PAValuesNormal Range Annulus:nm* [2.1 - 2.5]PA Main:nm* [1.5 - 2.1] Aorta Sin:nm* [2.7 - 3.3] RIGHT VENTRICLE ST Junction:nm* [2.3 - 2.9]RV Base:nm* [ < 4.2] Asc.Aorta:nm* [2.3 - 3.1] RV Mid:nm* [ < 3.5]  LEFT VENTRICLERV Length:nm* [ < 8.6] LVIDd:nm* [3.9 - 5.3] INFERIOR VENA CAVA LVIDs:nm* Max. IVC:nm* [ <= 2.1]  FS:nm* [> 25]Min. IVC:nm* SWT:nm* [0.5  - 0.9] ------------------ PWT:nm* [0.5 - 0.9] nm* - not measured  LEFT ATRIUM LA Diam:nm* [2.7 - 3.8] LA A4C Area:nm* [ < 20] LA Volume:nm* [22 - 52]  ___________________________________________________________________________________________ INTERPRETATION Normal Stress Echocardiogram NORMAL RIGHT VENTRICULAR SYSTOLIC FUNCTION MILD VALVULAR REGURGITATION (See above) NO VALVULAR STENOSIS NOTED Resting EF: >55% (Est.) Post Stress EF:>55% (Est.) Contraction: Normal Contraction: Normal   ___________________________________________________________________________________________ Electronically signed by: MD Mariel KanskyKen Fath on 06/28/2015 08:29 AM Performed By: Mathis BudMartin, Matthew, RDCS, RVT Ordering Physician: Harold HedgeFATH, KENNETH  ___________________________________________________________________________________________  Status Results Details    [facility]")' href="[email or web address]/">Encounter Summary

## 2016-01-11 NOTE — Pre-Procedure Instructions (Signed)
Denton Ar, MD - 07/04/2015 11:45 AM EST Formatting of this note may be different from the original.   Chief Complaint: Chief Complaint  Patient presents with  . Follow-up  had an stress echo and wore a holter  Date of Service: 07/04/2015 Date of Birth: 1946-01-08 PCP: Jillene Bucks TATE, MD   History of Present Illness: Dana [name] is a 70 y.o.female patient who is referred for evaluation of palpitations chest pain and shortness of breath. Patient is noted her heart beating more rapid as in the past. She denies excessive caffeine intake. Patient underwent a stress test on Holter monitor. The Holter monitor revealed an average heart rate of 90 bpm. There were no significant ectopic beats. Stress test revealed normal LV function on echo. There was no exercise-induced arrhythmia or ischemia. Past Medical and Surgical History  Past Medical History Past Medical History  Diagnosis Date  . Depression, unspecified  . Diabetes mellitus type 2, uncomplicated (CMS-HCC)  . Hyperlipidemia, unspecified  . Migraine headache  . Osteoarthritis  . Osteoporosis, post-menopausal   Past Surgical History She has a past surgical history that includes Replacement unicondylar joint knee (Left); Muscle Biopsy; and Joint replacement.   Medications and Allergies  Current Medications  Current Outpatient Prescriptions  Medication Sig Dispense Refill  . ALPRAZolam (XANAX) 0.5 MG tablet Take 0.5 mg by mouth nightly as needed for Sleep.  Marland Kitchen aspirin 325 MG EC tablet Take 325 mg by mouth once daily.  Marland Kitchen atorvastatin (LIPITOR) 40 MG tablet Take one tab in the pm  . CRANBERRY FRUIT EXTRACT (CRANBERRY EXTRACT ORAL) Take 1 mg by mouth once daily.  . diphenhydrAMINE (ALLERGY) 25 mg tablet Take 25 mg by mouth every 6 (six) hours as needed for Itching.  . DULoxetine (CYMBALTA) 60 MG DR capsule Take 60 mg by mouth once daily.  . metFORMIN (GLUCOPHAGE) 500 MG tablet Take 500 mg by mouth 2 (two) times daily with meals.  Take 2 tabs in the am and 2 tabs in the pm   No current facility-administered medications for this visit.   Allergies: Review of patient's allergies indicates no known allergies.  Social and Family History  Social History reports that she has been smoking Cigarettes. She has been smoking about 0.25 packs per day. She uses smokeless tobacco. She reports that she does not drink alcohol or use illicit drugs.  Family History Family History  Problem Relation Age of Onset  . Diabetes type II Father 17  . Diabetes mellitus Father  . Diabetes type II Sister 43  . Kidney disease Sister  . Clotting disorder Paternal Grandmother   Review of Systems  Review of Systems  Constitutional: Negative for chills, diaphoresis, fever, malaise/fatigue and weight loss.  HENT: Negative for congestion, ear discharge, hearing loss and tinnitus.  Eyes: Negative for blurred vision.  Respiratory: Positive for shortness of breath. Negative for cough, hemoptysis, sputum production and wheezing.  Cardiovascular: Positive for chest pain and palpitations. Negative for orthopnea, claudication, leg swelling and PND.  Gastrointestinal: Negative for abdominal pain, blood in stool, constipation, diarrhea, heartburn, melena, nausea and vomiting.  Genitourinary: Negative for dysuria, frequency, hematuria and urgency.  Musculoskeletal: Negative for back pain, falls, joint pain and myalgias.  Skin: Negative for itching and rash.  Neurological: Negative for dizziness, tingling, focal weakness, loss of consciousness, weakness and headaches.  Endo/Heme/Allergies: Negative for polydipsia. Does not bruise/bleed easily.  Psychiatric/Behavioral: Negative for depression, memory loss and substance abuse. The patient is not nervous/anxious.   Physical Examination  Vitals: Visit Vitals  . BP 142/88 (BP Location: Left upper arm, Patient Position: Sitting, BP Cuff Size: Adult)  . Pulse 80  . Resp 12  . Ht 167.6 cm (5\' 6" )  . Wt  81.3 kg (179 lb 3.2 oz)  . BMI 28.92 kg/m2   Ht:167.6 cm (5\' 6" ) Wt:81.3 kg (179 lb 3.2 oz) ZOX:WRUE surface area is 1.95 meters squared. Body mass index is 28.92 kg/(m^2).  Wt Readings from Last 3 Encounters:  07/04/15 81.3 kg (179 lb 3.2 oz)  06/15/15 81.6 kg (180 lb)  04/05/14 93.9 kg (207 lb)   BP Readings from Last 3 Encounters:  07/04/15 142/88  06/15/15 130/64  04/05/14 (!) 157/94   General appearance appears in no acute distress  Head Mouth and Eye exam Normocephalic, without obvious abnormality, atraumatic Dentition is good Eyes appear anicteric   Neck exam Thyroid: normal  Nodes: no obvious adenopathy  LUNGS Breath Sounds: Normal Percussion: Normal  CARDIOVASCULAR JVP CV wave: no HJR: no Elevation at 90 degrees: None Carotid Pulse: normal pulsation bilaterally Bruit: None Apex: apical impulse normal  Auscultation Rhythm: normal sinus rhythm S1: normal S2: normal Clicks: no Rub: no Murmurs: 1 to 2/6 systolic murmur Gallop: None ABDOMEN Liver enlargement: no Pulsatile aorta: no Ascites: no Bruits: no  EXTREMITIES Clubbing: no Edema: 1+ bilateral pedal edema Pulses: peripheral pulses symmetrical Femoral Bruits: no Amputation: no SKIN Rash: no Cyanosis: no Embolic phemonenon: no Bruising: no NEURO Alert and Oriented to person, place and time: yes Non focal: yes  PSYCH: Pt appears to have normal affect  Diagnostic Studies Reviewed:  EKG EKG demonstrated normal sinus rhythm, nonspecific ST and T waves changes.  Assessment and Plan   70 y.o. female with  ICD-10-CM ICD-9-CM  1. History of tachycardia-patient has relatively rapid resting heart rate however it is less than 100 and she has some improvement overnight. There were no sustained arrhythmias on Holter monitor. A54.098 V13.89     2. Atypical chest pain-no evidence of ischemia on ETT. Echo images revealed normal structural heart R07.89 786.59   3. Congestive heart  failure,-no significant clinical evidence of heart failure. LV function normal. Continue with current regimen I50.9 428.0  4. DDD (degenerative disc disease), lumbar M51.36 722.52   Return in about 1 year (around 07/03/2016).  These notes generated with voice recognition software. I apologize for typographical errors.  Denton Ar, MD       Plan of Treatment - as of this encounter  Upcoming Encounters Upcoming Encounters  Date Type Specialty Care Team Description  01/22/2016 Post Op Orthopaedics Patience Musca, [place]  [address] [place], [place] [address]  [phone]  [phone] (Fax)    02/02/2016 Post Op Orthopaedics Patience Musca, [place]  [address]  [place], [place] [address]  [phone]  [phone] (Fax)     Visit Diagnoses - in this encounter  Diagnosis  Diastolic congestive heart failure, unspecified congestive heart failure chronicity (CMS-HCC) - Primary  DDD (degenerative disc disease), lumbar  Degeneration of lumbar or lumbosacral intervertebral disc    Discontinued Medications - as of this encounter  Prescription Sig. Discontinue Reason Start Date End Date  metFORMIN (GLUCOPHAGE) 500 MG tablet  Take 500 mg by mouth 3 (three) times daily.    07/04/2015  simvastatin (ZOCOR) 40 MG tablet  Take 40 mg by mouth nightly. Reported on 07/04/2015 Discontinued by another clinician  07/04/2015   Historical Medications - added in this encounter  This list may reflect changes  made after this encounter.  Medication Sig. Disp. Refills Start Date End Date  aspirin 325 MG EC tablet  Take 325 mg by mouth once daily.      CRANBERRY FRUIT EXTRACT (CRANBERRY EXTRACT ORAL)  Take 1 mg by mouth once daily.      atorvastatin (LIPITOR) 40 MG tablet  Take one tab in the pm   04/13/2015   metFORMIN (GLUCOPHAGE) 500 MG tablet  Take 500 mg by mouth 2 (two) times daily with meals. Take 2 tabs in the am and 2 tabs  in the pm      diphenhydrAMINE (ALLERGY) 25 mg tablet  Take 25 mg by mouth every 6 (six) hours as needed for Itching.    12/25/2015   Document Information  Service Providers Document Coverage Dates Mar. 07, 2017 - Mar. 14, 2017  Custodian Organization [facility] [phone] (Work) [place], [place] [id number]   Encounter Providers Denton ArKenneth Alan Fath MD (Attending) [phone] (Work) fax:[phone] [address] [facility] [place], [place] [id number]   Encounter Date Mar. 07, 2017

## 2016-01-11 NOTE — Pre-Procedure Instructions (Signed)
Procedure: 24 hour Holter monitor  Indication: Tachyarrythmia  Ordering Provider:Dr. Lady GaryFath Interpreting Physician:Dr. Fath  Clinical History: 70 y.o. year old female  Hookup Date/Time:06/14/2014/ Total Duration Recorded: 24 hours 52 minutes  Findings: The minimum heart rate was approximately 70 bpm  The maximum heart rate was approximately 158 bpm  The average heart rate was 90 bpm. Approximately 0 premature ventricular beat (s) were noted There were 0 ventricular run(s) noted  Approximately 2 supraventricular beat(s) were noted There were 0 SVT run(s) noted  Reported symptoms: No diary was submitted  IMPRESSION: Normal rhythm with sinus rhythm sinus tachycardia and average rate of 90.There were very rare ectopic beats.Predominantly normal sinus rhythm to sinus tachycardia.No pauses.  Denton ArKENNETH ALAN FATH, MD  Status

## 2016-01-17 MED ORDER — CEFAZOLIN SODIUM-DEXTROSE 2-4 GM/100ML-% IV SOLN
2.0000 g | Freq: Once | INTRAVENOUS | Status: AC
  Administered 2016-01-18: 2 g via INTRAVENOUS

## 2016-01-18 ENCOUNTER — Ambulatory Visit: Payer: PPO | Admitting: Anesthesiology

## 2016-01-18 ENCOUNTER — Encounter
Admission: RE | Disposition: A | Discharge status: Home / Self Care | Payer: Self-pay | Source: Ambulatory Visit | Attending: Orthopedic Surgery

## 2016-01-18 ENCOUNTER — Encounter: Payer: Self-pay | Admitting: *Deleted

## 2016-01-18 ENCOUNTER — Ambulatory Visit
Admission: RE | Admit: 2016-01-18 | Discharge: 2016-01-18 | Disposition: A | Discharge status: Home / Self Care | Payer: PPO | Source: Ambulatory Visit | Attending: Orthopedic Surgery | Admitting: Orthopedic Surgery

## 2016-01-18 DIAGNOSIS — F17200 Nicotine dependence, unspecified, uncomplicated: Secondary | ICD-10-CM | POA: Diagnosis not present

## 2016-01-18 DIAGNOSIS — Z7982 Long term (current) use of aspirin: Secondary | ICD-10-CM | POA: Diagnosis not present

## 2016-01-18 DIAGNOSIS — Z96652 Presence of left artificial knee joint: Secondary | ICD-10-CM | POA: Diagnosis not present

## 2016-01-18 DIAGNOSIS — Z7984 Long term (current) use of oral hypoglycemic drugs: Secondary | ICD-10-CM | POA: Diagnosis not present

## 2016-01-18 DIAGNOSIS — F329 Major depressive disorder, single episode, unspecified: Secondary | ICD-10-CM | POA: Diagnosis not present

## 2016-01-18 DIAGNOSIS — J449 Chronic obstructive pulmonary disease, unspecified: Secondary | ICD-10-CM | POA: Diagnosis not present

## 2016-01-18 DIAGNOSIS — Z79899 Other long term (current) drug therapy: Secondary | ICD-10-CM | POA: Diagnosis not present

## 2016-01-18 DIAGNOSIS — E119 Type 2 diabetes mellitus without complications: Secondary | ICD-10-CM | POA: Insufficient documentation

## 2016-01-18 DIAGNOSIS — G5601 Carpal tunnel syndrome, right upper limb: Secondary | ICD-10-CM | POA: Insufficient documentation

## 2016-01-18 DIAGNOSIS — E785 Hyperlipidemia, unspecified: Secondary | ICD-10-CM | POA: Diagnosis not present

## 2016-01-18 DIAGNOSIS — G5621 Lesion of ulnar nerve, right upper limb: Secondary | ICD-10-CM | POA: Insufficient documentation

## 2016-01-18 HISTORY — PX: CARPAL TUNNEL RELEASE: SHX101

## 2016-01-18 LAB — BASIC METABOLIC PANEL
Anion gap: 10 (ref 5–15)
BUN: 12 mg/dL (ref 6–20)
CALCIUM: 9.3 mg/dL (ref 8.9–10.3)
CO2: 23 mmol/L (ref 22–32)
CREATININE: 0.66 mg/dL (ref 0.44–1.00)
Chloride: 108 mmol/L (ref 101–111)
GFR calc Af Amer: >60 mL/min (ref >60)
GLUCOSE: 176 mg/dL — AB (ref 65–99)
POTASSIUM: 3.6 mmol/L (ref 3.5–5.1)
SODIUM: 141 mmol/L (ref 135–145)

## 2016-01-18 LAB — GLUCOSE, CAPILLARY
GLUCOSE-CAPILLARY: 201 mg/dL — AB (ref 65–99)
GLUCOSE-CAPILLARY: 164 mg/dL — AB (ref 65–99)

## 2016-01-18 SURGERY — CARPAL TUNNEL RELEASE
Anesthesia: General | Site: Wrist | Laterality: Right | Wound class: Clean

## 2016-01-18 MED ORDER — FENTANYL CITRATE (PF) 100 MCG/2ML IJ SOLN
INTRAMUSCULAR | Status: DC | PRN
  Administered 2016-01-18: 50 ug via INTRAVENOUS
  Administered 2016-01-18 (×2): 25 ug via INTRAVENOUS

## 2016-01-18 MED ORDER — HYDROCODONE-ACETAMINOPHEN 5-325 MG PO TABS
1.0000 | ORAL_TABLET | ORAL | Status: DC | PRN
  Administered 2016-01-18: 1 via ORAL

## 2016-01-18 MED ORDER — PROPOFOL 10 MG/ML IV BOLUS
INTRAVENOUS | Status: DC | PRN
  Administered 2016-01-18: 150 mg via INTRAVENOUS

## 2016-01-18 MED ORDER — FAMOTIDINE 20 MG PO TABS
ORAL_TABLET | ORAL | Status: AC
  Administered 2016-01-18: 20 mg via ORAL
  Filled 2016-01-18: qty 1

## 2016-01-18 MED ORDER — MIDAZOLAM HCL 5 MG/5ML IJ SOLN
INTRAMUSCULAR | Status: DC | PRN
  Administered 2016-01-18: 2 mg via INTRAVENOUS

## 2016-01-18 MED ORDER — DEXAMETHASONE SODIUM PHOSPHATE 10 MG/ML IJ SOLN
INTRAMUSCULAR | Status: DC | PRN
  Administered 2016-01-18: 5 mg via INTRAVENOUS

## 2016-01-18 MED ORDER — FENTANYL CITRATE (PF) 100 MCG/2ML IJ SOLN: 25.0000 ug | INTRAMUSCULAR | Status: DC | PRN

## 2016-01-18 MED ORDER — HYDROCODONE-ACETAMINOPHEN 5-325 MG PO TABS
ORAL_TABLET | ORAL | Status: AC
  Filled 2016-01-18: qty 1

## 2016-01-18 MED ORDER — OXYCODONE HCL 5 MG/5ML PO SOLN: 5.0000 mg | Freq: Once | ORAL | Status: DC | PRN

## 2016-01-18 MED ORDER — PHENYLEPHRINE HCL 10 MG/ML IJ SOLN
INTRAMUSCULAR | Status: DC | PRN
  Administered 2016-01-18 (×2): 100 ug via INTRAVENOUS

## 2016-01-18 MED ORDER — SODIUM CHLORIDE 0.9 % IV SOLN: INTRAVENOUS | Status: DC

## 2016-01-18 MED ORDER — ONDANSETRON HCL 4 MG/2ML IJ SOLN: 4.0000 mg | Freq: Four times a day (QID) | INTRAMUSCULAR | Status: DC | PRN

## 2016-01-18 MED ORDER — METOCLOPRAMIDE HCL 5 MG/ML IJ SOLN: 5.0000 mg | Freq: Three times a day (TID) | INTRAMUSCULAR | Status: DC | PRN

## 2016-01-18 MED ORDER — EPHEDRINE SULFATE 50 MG/ML IJ SOLN
INTRAMUSCULAR | Status: DC | PRN
  Administered 2016-01-18: 10 mg via INTRAVENOUS

## 2016-01-18 MED ORDER — ONDANSETRON HCL 4 MG PO TABS: 4.0000 mg | ORAL_TABLET | Freq: Four times a day (QID) | ORAL | Status: DC | PRN

## 2016-01-18 MED ORDER — ONDANSETRON HCL 4 MG/2ML IJ SOLN
INTRAMUSCULAR | Status: DC | PRN
  Administered 2016-01-18: 4 mg via INTRAVENOUS

## 2016-01-18 MED ORDER — CEFAZOLIN SODIUM-DEXTROSE 2-4 GM/100ML-% IV SOLN
INTRAVENOUS | Status: AC
  Filled 2016-01-18: qty 100

## 2016-01-18 MED ORDER — BUPIVACAINE HCL (PF) 0.5 % IJ SOLN
INTRAMUSCULAR | Status: AC
  Filled 2016-01-18: qty 30

## 2016-01-18 MED ORDER — METOCLOPRAMIDE HCL 10 MG PO TABS: 5.0000 mg | ORAL_TABLET | Freq: Three times a day (TID) | ORAL | Status: DC | PRN

## 2016-01-18 MED ORDER — SODIUM CHLORIDE 0.9 % IV SOLN
INTRAVENOUS | Status: DC
  Administered 2016-01-18: 50 mL/h via INTRAVENOUS

## 2016-01-18 MED ORDER — GLYCOPYRROLATE 0.2 MG/ML IJ SOLN
INTRAMUSCULAR | Status: DC | PRN
  Administered 2016-01-18: 0.2 mg via INTRAVENOUS

## 2016-01-18 MED ORDER — FAMOTIDINE 20 MG PO TABS
20.0000 mg | ORAL_TABLET | Freq: Once | ORAL | Status: AC
  Administered 2016-01-18: 20 mg via ORAL

## 2016-01-18 MED ORDER — OXYCODONE HCL 5 MG PO TABS: 5.0000 mg | ORAL_TABLET | Freq: Once | ORAL | Status: DC | PRN

## 2016-01-18 MED ORDER — BUPIVACAINE HCL 0.5 % IJ SOLN
INTRAMUSCULAR | Status: DC | PRN
  Administered 2016-01-18: 10 mL

## 2016-01-18 MED ORDER — HYDROCODONE-ACETAMINOPHEN 5-325 MG PO TABS: 1.0000 | ORAL_TABLET | Freq: Four times a day (QID) | ORAL | 0 refills | Status: DC | PRN

## 2016-01-18 MED ORDER — LIDOCAINE HCL (PF) 2 % IJ SOLN
INTRAMUSCULAR | Status: DC | PRN
  Administered 2016-01-18: 50 mg

## 2016-01-18 SURGICAL SUPPLY — 30 items
BANDAGE ACE 3X5.8 VEL STRL LF (GAUZE/BANDAGES/DRESSINGS) ×3 IMPLANT
BNDG ESMARK 4X12 TAN STRL LF (GAUZE/BANDAGES/DRESSINGS) ×3 IMPLANT
CANISTER SUCT 1200ML W/VALVE (MISCELLANEOUS) ×3 IMPLANT
CHLORAPREP W/TINT 26ML (MISCELLANEOUS) ×3 IMPLANT
CUFF TOURN 18 STER (MISCELLANEOUS) ×2 IMPLANT
ELECT CAUTERY NDL 2.0 MIC (NEEDLE) IMPLANT
ELECT CAUTERY NEEDLE 2.0 MIC (NEEDLE) IMPLANT
ELECT CAUTERY NEEDLE TIP 1.0 (MISCELLANEOUS)
ELECTRODE CAUTERY NEDL TIP 1.0 (MISCELLANEOUS) IMPLANT
GAUZE PETRO XEROFOAM 1X8 (MISCELLANEOUS) ×3 IMPLANT
GAUZE SPONGE 4X4 12PLY STRL (GAUZE/BANDAGES/DRESSINGS) ×3 IMPLANT
GLOVE BIOGEL PI IND STRL 9 (GLOVE) ×1 IMPLANT
GLOVE BIOGEL PI INDICATOR 9 (GLOVE) ×2
GLOVE SURG SYN 9.0  PF PI (GLOVE) ×2
GLOVE SURG SYN 9.0 PF PI (GLOVE) ×1 IMPLANT
GOWN SRG 2XL LVL 4 RGLN SLV (GOWNS) ×1 IMPLANT
GOWN STRL NON-REIN 2XL LVL4 (GOWNS) ×3
GOWN STRL REUS W/ TWL LRG LVL3 (GOWN DISPOSABLE) ×1 IMPLANT
GOWN STRL REUS W/TWL 2XL LVL3 (GOWN DISPOSABLE) ×3 IMPLANT
GOWN STRL REUS W/TWL LRG LVL3 (GOWN DISPOSABLE) ×3
KIT RM TURNOVER STRD PROC AR (KITS) ×3 IMPLANT
NS IRRIG 500ML POUR BTL (IV SOLUTION) ×3 IMPLANT
PACK EXTREMITY ARMC (MISCELLANEOUS) ×3 IMPLANT
PAD CAST CTTN 4X4 STRL (SOFTGOODS) ×1 IMPLANT
PADDING CAST COTTON 4X4 STRL (SOFTGOODS) ×3
STOCKINETTE 48X4 2 PLY STRL (GAUZE/BANDAGES/DRESSINGS) ×1 IMPLANT
STOCKINETTE STRL 4IN 9604848 (GAUZE/BANDAGES/DRESSINGS) ×3 IMPLANT
SUT ETHILON 4-0 (SUTURE) ×3
SUT ETHILON 4-0 FS2 18XMFL BLK (SUTURE) ×1
SUTURE ETHLN 4-0 FS2 18XMF BLK (SUTURE) ×1 IMPLANT

## 2016-01-18 NOTE — H&P (Signed)
Reviewed paper H+P, will be scanned into chart. No changes noted.  

## 2016-01-18 NOTE — Anesthesia Procedure Notes (Signed)
Procedure Name: LMA Insertion Performed by: Aeliana Spates Pre-anesthesia Checklist: Patient identified, Patient being monitored, Timeout performed, Emergency Drugs available and Suction available Patient Re-evaluated:Patient Re-evaluated prior to inductionOxygen Delivery Method: Circle system utilized Preoxygenation: Pre-oxygenation with 100% oxygen Intubation Type: IV induction LMA: LMA inserted LMA Size: 4.0 Tube type: Oral Number of attempts: 1 Placement Confirmation: positive ETCO2 and breath sounds checked- equal and bilateral Tube secured with: Tape Dental Injury: Teeth and Oropharynx as per pre-operative assessment        

## 2016-01-18 NOTE — Transfer of Care (Signed)
Immediate Anesthesia Transfer of Care Note  Patient: Dana [name]  Procedure(s) Performed: Procedure(s): CARPAL TUNNEL RELEASE AND GUYUNS CANAL RIGHT HAND (Right)  Patient Location: PACU  Anesthesia Type:General  Level of Consciousness: sedated  Airway & Oxygen Therapy: Patient Spontanous Breathing and Patient connected to face mask oxygen  Post-op Assessment: Report given to RN and Post -op Vital signs reviewed and stable  Post vital signs: Reviewed  Last Vitals:  Vitals:   01/18/16 0945 01/18/16 1221  BP: (!) 142/91 (!) 101/56  Pulse: (!) 101 92  Resp: 16 12  Temp: 36.6 C 36.5 C    Last Pain:  Vitals:   01/18/16 0945  TempSrc: Oral  PainSc: 2       Patients Stated Pain Goal: 0 (01/18/16 0945)  Complications: No apparent anesthesia complications

## 2016-01-18 NOTE — Op Note (Signed)
01/18/2016  12:16 PM  PATIENT:  Dana [name]  70 y.o. female  PRE-OPERATIVE DIAGNOSIS:  GUYUNS SYNDROME, CARPAL TUNNEL SYNDROME RIGHT   POST-OPERATIVE DIAGNOSIS:  GUYUNS SYNDROME, CARPAL TUNNEL SYNDROME RIGHT   PROCEDURE:  Procedure(s): CARPAL TUNNEL RELEASE AND GUYUNS CANAL RIGHT HAND (Right)  SURGEON: Leitha SchullerMichael J Mccartney Brucks, MD  ASSISTANTS: None  ANESTHESIA:   general  EBL:  Total I/O In: 400 [I.V.:400] Out: -   BLOOD ADMINISTERED:none  DRAINS: none   LOCAL MEDICATIONS USED:  MARCAINE     SPECIMEN:  No Specimen  DISPOSITION OF SPECIMEN:  N/A  COUNTS:  YES  TOURNIQUET:    IMPLANTS: None  DICTATION: .Dragon Dictation patient brought the operating room and after adequate general anesthesia was obtained the right arm was prepped and draped in sterile fashion. The timeout procedure and patient identification procedure were completed tourniquet raised to her 50 murmurs mercury. Skin incision was made in line with ring metacarpal approximate 2 and half centimeters in length just distal the wrist flexion crease. Subcutaneous tissues tissue spread and the transverse carpal ligament identified. The Guyon's canal was opened first with incision down through the transcarpal ligament and a vascular hemostat placed deep to this with release carried out distally the muscle was split to allow make sure the nerve was compressed and then proximally proximal to the wrist flexion crease the nerve was uncovered next the transverse carpal ligament was incised just radial to this in the over the carpal tunnel and released carried up proximally and distally until there is fat noted on the nerve distally, and the proximal third of the carpal tunnel there was some constriction of the nerve after release more proximally there appeared to be good vascular blush and adequate decompression of the median nerve as well. The wound was irrigated and 10 cc of half percent Sensorcaine infiltrated to the incision  for postop analgesia. Wound was closed simple interrupted 4-0 nylon skin sutures. Xeroform 4 x 4 web roll and Ace wrap applied  PLAN OF CARE: Discharge to home after PACU  PATIENT DISPOSITION:  PACU - hemodynamically stable.

## 2016-01-18 NOTE — Anesthesia Preprocedure Evaluation (Signed)
Anesthesia Evaluation  Patient identified by MRN, date of birth, ID band Patient awake    Reviewed: Allergy & Precautions, H&P , NPO status , Patient's Chart, lab work & pertinent test results  History of Anesthesia Complications Negative for: history of anesthetic complications  Airway Mallampati: III  TM Distance: <3 FB Neck ROM: limited    Dental  (+) Poor Dentition, Chipped   Pulmonary COPD, Current Smoker,    breath sounds clear to auscultation + decreased breath sounds      Cardiovascular Exercise Tolerance: Good (-) angina(-) Past MI and (-) DOE negative cardio ROS Normal cardiovascular exam Rhythm:regular Rate:Normal     Neuro/Psych PSYCHIATRIC DISORDERS negative neurological ROS     GI/Hepatic negative GI ROS, Neg liver ROS, neg GERD  ,  Endo/Other  diabetes, Type 2, Oral Hypoglycemic Agents  Renal/GU      Musculoskeletal   Abdominal   Peds  Hematology negative hematology ROS (+)   Anesthesia Other Findings Past Medical History: No date: Diabetes mellitus without complication (HCC) No date: Fall at home     Comment: DUE TO DEHYDRATION-CARDIAC W/U NEGATIVE IN               MARCH 2017 [facility]  Past Surgical History: 2003: JOINT REPLACEMENT Left     Comment: PARTIAL KNEE No date: MUSCLE BIOPSY  BMI    Body Mass Index:  29.86 kg/m      Reproductive/Obstetrics negative OB ROS                             Anesthesia Physical Anesthesia Plan  ASA: III  Anesthesia Plan: General LMA   Post-op Pain Management:    Induction:   Airway Management Planned:   Additional Equipment:   Intra-op Plan:   Post-operative Plan:   Informed Consent: I have reviewed the patients History and Physical, chart, labs and discussed the procedure including the risks, benefits and alternatives for the proposed anesthesia with the patient or authorized representative who has indicated  his/her understanding and acceptance.   Dental Advisory Given  Plan Discussed with: Anesthesiologist, CRNA and Surgeon  Anesthesia Plan Comments:         Anesthesia Quick Evaluation

## 2016-01-18 NOTE — Progress Notes (Signed)
Can wiggle fingers onright   Fingers warm and dry

## 2016-01-18 NOTE — Anesthesia Postprocedure Evaluation (Signed)
Anesthesia Post Note  Patient: Dana [name]  Procedure(s) Performed: Procedure(s) (LRB): CARPAL TUNNEL RELEASE AND GUYUNS CANAL RIGHT HAND (Right)  Patient location during evaluation: PACU Anesthesia Type: General Level of consciousness: awake and alert Pain management: pain level controlled Vital Signs Assessment: post-procedure vital signs reviewed and stable Respiratory status: spontaneous breathing, nonlabored ventilation, respiratory function stable and patient connected to nasal cannula oxygen Cardiovascular status: blood pressure returned to baseline and stable Postop Assessment: no signs of nausea or vomiting Anesthetic complications: no    Last Vitals:  Vitals:   01/18/16 0945 01/18/16 1221  BP: (!) 142/91 101/65  Pulse: (!) 101 92  Resp: 16 12  Temp: 36.6 C 36.5 C    Last Pain:  Vitals:   01/18/16 1221  TempSrc:   PainSc: Asleep                 Cleda MccreedyJoseph K Piscitello

## 2016-01-18 NOTE — Discharge Instructions (Addendum)
AMBULATORY SURGERY  DISCHARGE INSTRUCTIONS   1) The drugs that you were given will stay in your system until tomorrow so for the next 24 hours you should not:  A) Drive an automobile B) Make any legal decisions C) Drink any alcoholic beverage   2) You may resume regular meals tomorrow.  Today it is better to start with liquids and gradually work up to solid foods.  You may eat anything you prefer, but it is better to start with liquids, then soup and crackers, and gradually work up to solid foods.   3) Please notify your doctor immediately if you have any unusual bleeding, trouble breathing, redness and pain at the surgery site, drainage, fever, or pain not relieved by medication. 4)   5) Your post-operative visit with Dr.                                     is: Date:                        Time:    Please call to schedule your post-operative visit.  6) Additional Instructions:   Work fingers is much as possible. Loosen Ace wrap for discharge and if fingers swell. Keep arm elevated as much as possible. Pain medicine as directed

## 2016-03-06 DIAGNOSIS — E119 Type 2 diabetes mellitus without complications: Secondary | ICD-10-CM | POA: Diagnosis not present

## 2016-03-06 DIAGNOSIS — E785 Hyperlipidemia, unspecified: Secondary | ICD-10-CM | POA: Diagnosis not present

## 2016-03-12 DIAGNOSIS — E119 Type 2 diabetes mellitus without complications: Secondary | ICD-10-CM | POA: Diagnosis not present

## 2016-03-13 DIAGNOSIS — Z23 Encounter for immunization: Secondary | ICD-10-CM | POA: Diagnosis not present

## 2016-03-13 DIAGNOSIS — E785 Hyperlipidemia, unspecified: Secondary | ICD-10-CM | POA: Diagnosis not present

## 2016-03-13 DIAGNOSIS — E119 Type 2 diabetes mellitus without complications: Secondary | ICD-10-CM | POA: Diagnosis not present

## 2016-03-21 DIAGNOSIS — H6691 Otitis media, unspecified, right ear: Secondary | ICD-10-CM | POA: Diagnosis not present

## 2016-04-09 DIAGNOSIS — J019 Acute sinusitis, unspecified: Secondary | ICD-10-CM | POA: Diagnosis not present

## 2016-07-17 DIAGNOSIS — E785 Hyperlipidemia, unspecified: Secondary | ICD-10-CM | POA: Diagnosis not present

## 2016-07-17 DIAGNOSIS — E119 Type 2 diabetes mellitus without complications: Secondary | ICD-10-CM | POA: Diagnosis not present

## 2016-07-24 DIAGNOSIS — E119 Type 2 diabetes mellitus without complications: Secondary | ICD-10-CM | POA: Diagnosis not present

## 2016-07-24 DIAGNOSIS — E785 Hyperlipidemia, unspecified: Secondary | ICD-10-CM | POA: Diagnosis not present

## 2016-08-28 DIAGNOSIS — N390 Urinary tract infection, site not specified: Secondary | ICD-10-CM | POA: Diagnosis not present

## 2016-11-27 DIAGNOSIS — E785 Hyperlipidemia, unspecified: Secondary | ICD-10-CM | POA: Diagnosis not present

## 2016-11-27 DIAGNOSIS — E119 Type 2 diabetes mellitus without complications: Secondary | ICD-10-CM | POA: Diagnosis not present

## 2016-12-04 DIAGNOSIS — E119 Type 2 diabetes mellitus without complications: Secondary | ICD-10-CM | POA: Diagnosis not present

## 2016-12-04 DIAGNOSIS — E785 Hyperlipidemia, unspecified: Secondary | ICD-10-CM | POA: Diagnosis not present

## 2016-12-26 ENCOUNTER — Other Ambulatory Visit: Payer: Self-pay

## 2016-12-27 ENCOUNTER — Encounter (INDEPENDENT_AMBULATORY_CARE_PROVIDER_SITE_OTHER): Payer: PPO | Admitting: Podiatry

## 2016-12-27 VITALS — BP 101/82 | HR 101 | Temp 98.2°F | Resp 16

## 2016-12-27 DIAGNOSIS — I739 Peripheral vascular disease, unspecified: Secondary | ICD-10-CM | POA: Diagnosis not present

## 2016-12-27 DIAGNOSIS — I872 Venous insufficiency (chronic) (peripheral): Secondary | ICD-10-CM

## 2016-12-27 NOTE — Progress Notes (Signed)
   Subjective:    Patient ID: Dana [name], female    DOB: [birth year], 71 y.o.   MRN: [id number]  HPI    Review of Systems  Constitutional: Positive for fatigue.  All other systems reviewed and are negative.      Objective:   Physical Exam        Assessment & Plan:    This encounter was created in error - please disregard.

## 2017-01-01 ENCOUNTER — Other Ambulatory Visit: Payer: Self-pay | Admitting: Podiatry

## 2017-01-01 DIAGNOSIS — I872 Venous insufficiency (chronic) (peripheral): Secondary | ICD-10-CM

## 2017-01-01 DIAGNOSIS — I739 Peripheral vascular disease, unspecified: Secondary | ICD-10-CM

## 2017-01-02 ENCOUNTER — Ambulatory Visit (INDEPENDENT_AMBULATORY_CARE_PROVIDER_SITE_OTHER): Payer: PPO

## 2017-01-02 ENCOUNTER — Encounter: Payer: Self-pay | Admitting: Cardiovascular Disease

## 2017-01-02 ENCOUNTER — Ambulatory Visit (INDEPENDENT_AMBULATORY_CARE_PROVIDER_SITE_OTHER): Payer: PPO | Admitting: Cardiovascular Disease

## 2017-01-02 VITALS — BP 122/80 | HR 121 | Ht 66.0 in | Wt 183.5 lb

## 2017-01-02 DIAGNOSIS — I872 Venous insufficiency (chronic) (peripheral): Secondary | ICD-10-CM

## 2017-01-02 DIAGNOSIS — I739 Peripheral vascular disease, unspecified: Secondary | ICD-10-CM | POA: Diagnosis not present

## 2017-01-02 DIAGNOSIS — R011 Cardiac murmur, unspecified: Secondary | ICD-10-CM | POA: Diagnosis not present

## 2017-01-02 DIAGNOSIS — Z7689 Persons encountering health services in other specified circumstances: Secondary | ICD-10-CM

## 2017-01-02 NOTE — Progress Notes (Signed)
Cardiology Office Note   Date:  01/02/2017   ID:  HALCYON HECK, DOB 09/17/1945, MRN [id number]  PCP:  Jaclyn Shaggy, MD  Cardiologist:   Lorine Bears, MD   Chief Complaint  Patient presents with  . other    PAD c/o rapid heart beat, hand/toes locking up, burning sensation and tingling/prickling underfeet. Meds reviewed verbally with pt.      History of Present Illness: Dana [name] is a 71 y.o. female who was referred by Dr. Logan Bores for evaluation and management of possible peripheral arterial disease. The patient has known history of diabetes mellitus, hyperlipidemia, depression and arthritis.  She had cardiac workup by Dr.Fath last year that included a treadmill stress test which was normal. Echocardiogram showed normal LV systolic function. She reports darkish discoloration of toes of both feet recently with some tingling and numbness especially at night. She reports no calf or thigh discomfort. She had vascular studies at [facility] today which showed normal ABI bilaterally. Venous Doppler showed evidence of venous incompetence in the right saphenous femoral junction and the great saphenous vein. There was evidence of also of venous incompetence on the left side. No evidence of DVT.  Past Medical History:  Diagnosis Date  . Diabetes mellitus without complication (HCC)   . Fall at home    DUE TO DEHYDRATION-CARDIAC W/U NEGATIVE IN MARCH 2017 WITH DR [facility]  . Hyperlipidemia     Past Surgical History:  Procedure Laterality Date  . CARPAL TUNNEL RELEASE Right 01/18/2016   Procedure: CARPAL TUNNEL RELEASE AND GUYUNS CANAL RIGHT HAND;  Surgeon: Kennedy Bucker, MD;  Location: [facility] ORS;  Service: Orthopedics;  Laterality: Right;  . JOINT REPLACEMENT Left 2003   PARTIAL KNEE  . MUSCLE BIOPSY       Current Outpatient Prescriptions  Medication Sig Dispense Refill  . aspirin 325 MG EC tablet Take 325 mg by mouth daily.    Marland Kitchen atorvastatin (LIPITOR) 40 MG tablet Take one  tab in the pm    . CRANBERRY PO Take 1 tablet by mouth as needed.     . docusate sodium (STOOL SOFTENER) 100 MG capsule Take 100 mg by mouth as needed for mild constipation.    . DULoxetine (CYMBALTA) 30 MG capsule Take 90 mg by mouth daily.     . Homeopathic Products (ALLERGY MEDICINE PO) Take 1 tablet by mouth daily.    Marland Kitchen LORazepam (ATIVAN) 0.5 MG tablet Take 0.5 mg by mouth 2 (two) times daily.     . metFORMIN (GLUCOPHAGE) 500 MG tablet Take 1,000 mg by mouth 2 (two) times daily with a meal.      No current facility-administered medications for this visit.     Allergies:   Erythromycin    Social History:  The patient  reports that she has been smoking Cigarettes.  She has a 11.25 pack-year smoking history. She has never used smokeless tobacco. She reports that she does not drink alcohol or use drugs.   Family History:  The patient's family history includes Heart disease in her father.    ROS:  Please see the history of present illness.   Otherwise, review of systems are positive for none.   All other systems are reviewed and negative.    PHYSICAL EXAM: VS:  BP 122/80 (BP Location: Right Arm, Patient Position: Sitting, Cuff Size: Normal)   Pulse (!) 121   Ht  (1.676 m)   Wt 183 lb 8 oz (83.2 kg)   BMI  29.62 kg/m  , BMI Body mass index is 29.62 kg/m. GEN: Well nourished, well developed, in no acute distress  HEENT: normal  Neck: no JVD, carotid bruits, or masses Cardiac: RRR But tachycardic; no  rubs, or gallops, trace edema . 2/6 systolic ejection murmur in the aortic area Respiratory:  clear to auscultation bilaterally, normal work of breathing GI: soft, nontender, nondistended, + BS MS: no deformity or atrophy  Skin: warm and dry, no rash Neuro:  Strength and sensation are intact Psych: euthymic mood, full affect Vascular: The pulses are diminished but palpable on both sides.  EKG:  EKG is ordered today. The ekg ordered today demonstrates sinus tachycardia with no  significant ST or T wave changes.   Recent Labs: 01/18/2016: BUN 12; Creatinine, Ser 0.66; Potassium 3.6; Sodium 141    Lipid Panel No results found for: CHOL, TRIG, HDL, CHOLHDL, VLDL, LDLCALC, LDLDIRECT    Wt Readings from Last 3 Encounters:  01/02/17 183 lb 8 oz (83.2 kg)  01/18/16 185 lb (83.9 kg)  01/11/16 185 lb (83.9 kg)        PAD Screen 01/02/2017  Previous PAD dx? No  Previous surgical procedure? No  Pain with walking? Yes  Subsides with rest? No  Feet/toe relief with dangling? No  Painful, non-healing ulcers? No  Extremities discolored? Yes      ASSESSMENT AND PLAN:  1.  Peripheral neuropathy and chronic venous insufficiency: I do not think the patient has significant peripheral arterial disease. Her pulses are palpable distally and ABI was normal with normal waveforms. She does have some degree of chronic venous insufficiency without significant leg edema. She can consider using knee-high support stockings. Some of her numbness and tingling is likely due to peripheral neuropathy related to diabetes.  2. Cardiac murmur: She has a heart murmur suggestive of aortic stenosis. I requested an echocardiogram.  3. Sinus tachycardia: The patient reports that this was due to walking from the parking lot in the hot weather. She had stress testing done last year. She reports no chest pain. Repeat stress test might be considered based on results of echocardiogram.    Disposition:   FU with me in 6 months  Signed,  Lorine BearsMuhammad Markee Remlinger, MD  01/02/2017 3:14 PM    [facility]

## 2017-01-02 NOTE — Patient Instructions (Addendum)
Medication Instructions:  Your physician recommends that you continue on your current medications as directed. Please refer to the Current Medication list given to you today.   Labwork: none  Testing/Procedures: Your physician has requested that you have an echocardiogram. Echocardiography is a painless test that uses sound waves to create images of your heart. It provides your doctor with information about the size and shape of your heart and how well your heart's chambers and valves are working. This procedure takes approximately one hour. There are no restrictions for this procedure.    Follow-Up: Your physician wants you to follow-up in: 6 months with Dr. Arida.  You will receive a reminder letter in the mail two months in advance. If you don't receive a letter, please call our office to schedule the follow-up appointment.   Any Other Special Instructions Will Be Listed Below (If Applicable).     If you need a refill on your cardiac medications before your next appointment, please call your pharmacy.  Echocardiogram An echocardiogram, or echocardiography, uses sound waves (ultrasound) to produce an image of your heart. The echocardiogram is simple, painless, obtained within a short period of time, and offers valuable information to your health care provider. The images from an echocardiogram can provide information such as:  Evidence of coronary artery disease (CAD).  Heart size.  Heart muscle function.  Heart valve function.  Aneurysm detection.  Evidence of a past heart attack.  Fluid buildup around the heart.  Heart muscle thickening.  Assess heart valve function.  Tell a health care provider about:  Any allergies you have.  All medicines you are taking, including vitamins, herbs, eye drops, creams, and over-the-counter medicines.  Any problems you or family members have had with anesthetic medicines.  Any blood disorders you have.  Any surgeries you have  had.  Any medical conditions you have.  Whether you are pregnant or may be pregnant. What happens before the procedure? No special preparation is needed. Eat and drink normally. What happens during the procedure?  In order to produce an image of your heart, gel will be applied to your chest and a wand-like tool (transducer) will be moved over your chest. The gel will help transmit the sound waves from the transducer. The sound waves will harmlessly bounce off your heart to allow the heart images to be captured in real-time motion. These images will then be recorded.  You may need an IV to receive a medicine that improves the quality of the pictures. What happens after the procedure? You may return to your normal schedule including diet, activities, and medicines, unless your health care provider tells you otherwise. This information is not intended to replace advice given to you by your health care provider. Make sure you discuss any questions you have with your health care provider. Document Released: 04/12/2000 Document Revised: 12/02/2015 Document Reviewed: 12/21/2012 Elsevier Interactive Patient Education  2017 Elsevier Inc.  

## 2017-01-04 NOTE — Progress Notes (Signed)
Patient ID: Dana [name], female   DOB: [birth year], 71 y.o.   MRN: [id number]   HPI: 71 year old female with a history of diabetes mellitus presents today for evaluation of pain and burning sensations with pins and needles to the bilateral feet of all of her toes. This is been going on for approximately 1 month now. There was a sudden onset and the patient denies any trauma. She says that all the toes started turning light purple. She states that the pain comes and goes and is worse at night when her feet are. When her feet are down she states that the toes turn purple. She has been using over-the-counter pain cream with no relief.  Past Medical History:  Diagnosis Date  . Diabetes mellitus without complication (HCC)   . Fall at home    D[facility]  . Hyper[facility]      Physical Exam: General: The patient is alert and oriented x3 in no acute distress.  Dermatology: There is some purple mottling and discoloration noted to the digits 1-5 bilateral. Skin is cool/cold to touch most notably on the digits bilateral feet, dry and supple bilateral lower extremities. Negative for open lesions or macerations.  Vascular: Nonpalpable pedal pulses bilaterally. No edema or erythema noted, with cool extremities and delayed capillary refill of the digits.  Neurological: Epicritic and protective threshold diminished bilaterally.   Musculoskeletal Exam: Range of motion within normal limits to all pedal and ankle joints bilateral. Muscle strength 5/5 in all groups bilateral.   Assessment: -  Diabetes mellitus with peripheral polyneuropathy -Peripheral vascular disease -Venous congestion bilateral   Plan of Care:  - Patient was evaluated today - Today we are going to refer the patient for a vascular consultation. Patient's pain and symptoms are likely vascular related - Today we are going to order an ABI and venous Doppler prior to the  patient's initial vascular appointment -  Return to clinic when necessary   Felecia ShellingBrent M. Evans, DPM [facility]  Dr. Felecia ShellingBrent M. Evans, DPM    [address]                                        [place], [place] [address]                Office [phone]  Fax [phone]

## 2017-01-13 ENCOUNTER — Other Ambulatory Visit: Payer: Self-pay

## 2017-01-13 ENCOUNTER — Ambulatory Visit (INDEPENDENT_AMBULATORY_CARE_PROVIDER_SITE_OTHER): Payer: PPO

## 2017-01-13 DIAGNOSIS — R011 Cardiac murmur, unspecified: Secondary | ICD-10-CM

## 2017-01-13 LAB — ECHOCARDIOGRAM COMPLETE
CHL CUP MV DEC (S): 114
CHL CUP STROKE VOLUME: 22 mL
CHL CUP TV REG PEAK VELOCITY: 400 cm/s
E decel time: 114 msec
EERAT: 15.52
FS: 29 % (ref 28–44)
IV/PV OW: 1
LA ID, A-P, ES: 27 mm
LA diam index: 1.4 cm/m2
LA vol A4C: 31.2 ml
LA vol: 34.3 mL
LAVOLIN: 17.8 mL/m2
LDCA: 3.14 cm2
LEFT ATRIUM END SYS DIAM: 27 mm
LV E/e'average: 15.52
LV PW d: 8 mm — AB (ref 0.6–1.1)
LV SIMPSON'S DISK: 71
LV dias vol index: 16 mL/m2
LV dias vol: 30 mL — AB (ref 46–106)
LV e' LATERAL: 4.78 cm/s
LV sys vol index: 5 mL/m2
LV sys vol: 9 mL — AB (ref 14–42)
LVEEMED: 15.52
LVOT diameter: 20 mm
MV pk A vel: 126 m/s
MV pk E vel: 74.2 m/s
MVPG: 2 mmHg
RV LATERAL S' VELOCITY: 13.5 cm/s
TAPSE: 18.7 mm
TDI e' lateral: 4.78
TDI e' medial: 6.08
TR max vel: 400 cm/s

## 2017-01-14 ENCOUNTER — Other Ambulatory Visit: Payer: Self-pay

## 2017-01-14 MED ORDER — METOPROLOL TARTRATE 25 MG PO TABS: 25.0000 mg | ORAL_TABLET | Freq: Two times a day (BID) | ORAL | 3 refills | Status: DC

## 2017-01-15 ENCOUNTER — Telehealth: Payer: Self-pay | Admitting: Cardiovascular Disease

## 2017-01-15 NOTE — Telephone Encounter (Signed)
Pt has a question regarding the "new medication" she was told she was going to have to take. She asks what is this for, and what is it going to do to help her. Please call and advise.

## 2017-01-15 NOTE — Telephone Encounter (Signed)
Pt called back with questions regarding echo results and need to start metoprolol. Reviewed pulmonary pressure information and medication. Pt states she understands much better at this time and is appreciative of the explanation.

## 2017-02-20 ENCOUNTER — Encounter: Payer: Self-pay | Admitting: Nurse Practitioner

## 2017-02-20 ENCOUNTER — Ambulatory Visit (INDEPENDENT_AMBULATORY_CARE_PROVIDER_SITE_OTHER): Payer: PPO | Admitting: Nurse Practitioner

## 2017-02-20 VITALS — BP 130/84 | HR 89 | Ht 66.0 in | Wt 182.0 lb

## 2017-02-20 DIAGNOSIS — I872 Venous insufficiency (chronic) (peripheral): Secondary | ICD-10-CM | POA: Diagnosis not present

## 2017-02-20 DIAGNOSIS — I2721 Secondary pulmonary arterial hypertension: Secondary | ICD-10-CM | POA: Diagnosis not present

## 2017-02-20 DIAGNOSIS — R000 Tachycardia, unspecified: Secondary | ICD-10-CM

## 2017-02-20 DIAGNOSIS — I5030 Unspecified diastolic (congestive) heart failure: Secondary | ICD-10-CM

## 2017-02-20 DIAGNOSIS — Z23 Encounter for immunization: Secondary | ICD-10-CM | POA: Diagnosis not present

## 2017-02-20 MED ORDER — METOPROLOL TARTRATE 50 MG PO TABS: 50.0000 mg | ORAL_TABLET | Freq: Two times a day (BID) | ORAL | 3 refills | Status: DC

## 2017-02-20 MED ORDER — METOPROLOL TARTRATE 25 MG PO TABS: 25.0000 mg | ORAL_TABLET | Freq: Two times a day (BID) | ORAL | 3 refills | Status: DC

## 2017-02-20 NOTE — Patient Instructions (Signed)
Medication Instructions:  Please INCREASE your metoprolol to 50 mg twice daily Flu shot today  Labwork: None  Testing/Procedures: None   Follow-Up: Your physician wants you to follow-up in: 6 months with Dr. Katheren PullerArida  You will receive a reminder letter in the mail two months in advance.  If you don't receive a letter, please call our office to schedule the follow-up appointment.   If you need a refill on your cardiac medications before your next appointment, please call your pharmacy.

## 2017-02-20 NOTE — Progress Notes (Addendum)
Office Visit    Patient Name: Dana [name] Date of Encounter: 02/20/2017  Primary Care Provider:  Jaclyn Shaggyate, Denny C, MD Primary Cardiologist:  Judie PetitM. Kirke CorinArida, MD   Chief Complaint    71 year old female with a history of tobacco abuse, COPD, hypertension, hyperlipidemia, sinus tachycardia, venous insufficiency, diabetes,  HFpEF, and chronic dyspnea on exertion, who presents for follow-up.  Past Medical History    Past Medical History:  Diagnosis Date  . Coronary artery calcification seen on CT scan    a. 06/2015 CTA Chest: coronary and Ao Ca2+ noted.  . Diabetes mellitus without complication (HCC)   . Diastolic dysfunction    a. 12/2016 Echo: Gr1 DD.  Marland Kitchen. Dyspnea on exertion    a. 05/2015 Stress Echo: normal wall motion, nl LV fxn;  b. 12/2016 Echo: EF 60-65%, small cavity szie w/ near cavity obliteration in systolie.  LVOT 67mmHg. No change w/ valsalva. Mild conc LVH, Gr1 DD, mild MR, nl RV fxn, PASP 67mmHg.  . Fall at home    DUE TO DEHYDRATION-CARDIAC [facility]  . Hyperlipidemia   . PAH (pulmonary artery hypertension) (HCC)    a. 12/2016 Echo: PASP 67mmHg.  . Sinus tachycardia    a. 06/2015 Holter: Sinus tach. No significant arrhythmias.  . Tobacco abuse   . Venous insufficiency    a. 12/2016 ABI wnl; b. 12/2016 Venous doppler: venous incompetence in R saphenous femoral jxn and the great saphenous vein. Venous incompetence on L. No DVT.   Past Surgical History:  Procedure Laterality Date  . CARPAL TUNNEL RELEASE Right 01/18/2016   Procedure: CARPAL TUNNEL RELEASE AND GUYUNS CANAL RIGHT HAND;  Surgeon: Kennedy BuckerMichael Menz, MD;  Location: [facility] ORS;  Service: Orthopedics;  Laterality: Right;  . JOINT REPLACEMENT Left 2003   PARTIAL KNEE  . MUSCLE BIOPSY      Allergies  Allergies  Allergen Reactions  . Erythromycin Nausea And Vomiting and Other (See Comments)    History of Present Illness    71 year old female with the above complex past medical  history including chronic dyspnea on exertion, ongoing tobacco abuse, sinus tachycardia, hyperlipidemia, diabetes, coronary artery calcification, pulmonary arterial hypertension, and venous insufficiency.  She was evaluated in the setting of dyspnea on exertion in 2017 with stress echocardiography, which was negative for wall motion abnormalities.  She notes chronic dyspnea on exertion which is unchanged over the past 1-3 years.  More recently, she noted discoloration of her toes on both feet.  ABIs were performed and were normal.  Venous Dopplers showed venous incompetence in the right saphenous femoral junction and the great saphenous vein.  There was also evidence of venous incompetence on the left side.  No DVT.  She was seen by Dr. Kirke CorinArida in clinic in September.  In the setting of cardiac murmur and dyspnea on exertion, an echocardiogram was performed and showed normal LV function with near cavity obliteration in systole.  Pulmonary pressure was also elevated at 67 mmHg.  She was placed on oral beta-blocker therapy.  Since her last visit, she notes doing well.  She has not had any lower extremity swelling.  She is chronic dyspnea on exertion at higher levels of activity or when walking when it is hot outside.  She says she has been doing well over the past month.  She denies chest pain, PND, orthopnea, dizziness, syncope, edema, or early satiety.  Home Medications    Prior to Admission medications   Medication Sig  Start Date End Date Taking? Authorizing Provider  aspirin 325 MG EC tablet Take 325 mg by mouth daily.   Yes [provider]  atorvastatin (LIPITOR) 40 MG tablet Take one tab in the pm 04/13/15  Yes [provider]  CRANBERRY PO Take 1 tablet by mouth as needed.    Yes [provider]  docusate sodium (STOOL SOFTENER) 100 MG capsule Take 100 mg by mouth as needed for mild constipation.   Yes [provider]  DULoxetine (CYMBALTA) 30 MG capsule Take 90 mg  by mouth daily.  12/05/16  Yes [provider]  Homeopathic Products (ALLERGY MEDICINE PO) Take 1 tablet by mouth daily.   Yes [provider]  metFORMIN (GLUCOPHAGE) 500 MG tablet Take 1,000 mg by mouth 2 (two) times daily with a meal.    Yes [provider]  metoprolol tartrate (LOPRESSOR) 25 MG tablet Take 1 tablet (25 mg total) by mouth 2 (two) times daily. 02/20/17 05/21/17 Yes Ok Anis, NP    Review of Systems    Some degree of chronic DOE.  She denies chest pain, palpitations, pnd, orthopnea, n, v, dizziness, syncope, edema, weight gain, or early satiety.  All other systems reviewed and are otherwise negative except as noted above.  Physical Exam    VS:  BP 130/84 (BP Location: Left Arm, Patient Position: Sitting, Cuff Size: Normal)   Pulse 89   Ht 5\' 6"  (1.676 m)   Wt 182 lb (82.6 kg)   BMI 29.38 kg/m  , BMI Body mass index is 29.38 kg/m. GEN: Well nourished, well developed, in no acute distress.  HEENT: normal.  Neck: Supple, no JVD, carotid bruits, or masses. Cardiac: RRR, 2/6 systolic ejection murmur at the upper sternal borders, no rubs, or gallops. No clubbing, cyanosis, edema.  Radials/DP/PT 2+ and equal bilaterally.  Respiratory:  Respirations regular and unlabored, clear to auscultation bilaterally. GI: Soft, nontender, nondistended, BS + x 4. MS: no deformity or atrophy. Skin: warm and dry, no rash. Neuro:  Strength and sensation are intact. Psych: Normal affect.  Accessory Clinical Findings    ECG -regular sinus rhythm, 89, no acute ST or T changes.  Assessment & Plan    1.  Pulmonary arterial hypertension: Recent echo showed normal LV function with a PAS P of 67 mmHg.  She was placed on beta-blocker therapy and notes stable though chronic dyspnea on exertion.  No edema since initiating beta-blocker.  She is euvolemic on exam today.  She does continue to smoke a few cigarettes a day and has smoked for many years.  Complete  cessation advised.  She reports prior sleep study about a year or so ago and she was told this was normal.  2.  Diastolic dysfunction/HFpEF heart rate better today on beta-blocker though still mildly elevated at 89.  Blood pressure 130/84.  I will increase metoprolol to 50 mg twice daily.  She is otherwise euvolemic on exam.  3.  Sinus tachycardia: As above, improved on beta-blocker.  Will titrate further today.  4.  Venous insufficiency: No recurrence of edema.   5.  DMII:  Well controlled on metformin. She reports recent A1c of 6.2.  Followed by PCP.  6.  Disposition: Follow-up in clinic in 6 months or sooner if necessary.   Nicolasa Ducking, NP 02/20/2017, 12:43 PM

## 2017-04-02 DIAGNOSIS — E785 Hyperlipidemia, unspecified: Secondary | ICD-10-CM | POA: Diagnosis not present

## 2017-04-02 DIAGNOSIS — E119 Type 2 diabetes mellitus without complications: Secondary | ICD-10-CM | POA: Diagnosis not present

## 2017-04-17 ENCOUNTER — Telehealth: Payer: Self-pay | Admitting: Cardiovascular Disease

## 2017-04-17 NOTE — Telephone Encounter (Signed)
Pt c/o medication issue:  1. Name of Medication: beta blocker  2. How are you currently taking this medication (dosage and times per day)? Two pills Twice a day, each is 50 mg   3. Are you having a reaction (difficulty breathing--STAT)? no  4. What is your medication issue?  She is getting short winded more easier. She states she can get up out of bed in the morning and is can feel like she's worked all night. She was taking one 50 mg in the morning and one at night She states it may be that we doubled it.

## 2017-04-17 NOTE — Telephone Encounter (Signed)
Pt states since metoprolol  increased to 50mg  BID  in October she has felt tired and weak. States she is taking two metoprolol BID. She does not monitor BP at home.  Reviewed prescription which is a 50mg  tablet indicating pt is taking 100mg  BID. Advised that her new prescription is 50mg  so she should be taking one tablet in the morning and one tablet in the evening.  She repeated correct dosage back to me and understands to take one tablet twice daily. She will monitor BP and HR at home and call if she continues to be symptomatic, is hypotensive, or bradycardic.

## 2017-04-28 DIAGNOSIS — E119 Type 2 diabetes mellitus without complications: Secondary | ICD-10-CM | POA: Diagnosis not present

## 2017-04-28 DIAGNOSIS — F330 Major depressive disorder, recurrent, mild: Secondary | ICD-10-CM | POA: Diagnosis not present

## 2017-04-28 DIAGNOSIS — E785 Hyperlipidemia, unspecified: Secondary | ICD-10-CM | POA: Diagnosis not present

## 2017-09-03 DIAGNOSIS — E785 Hyperlipidemia, unspecified: Secondary | ICD-10-CM | POA: Diagnosis not present

## 2017-09-03 DIAGNOSIS — E119 Type 2 diabetes mellitus without complications: Secondary | ICD-10-CM | POA: Diagnosis not present

## 2017-09-10 DIAGNOSIS — E785 Hyperlipidemia, unspecified: Secondary | ICD-10-CM | POA: Diagnosis not present

## 2017-09-10 DIAGNOSIS — E119 Type 2 diabetes mellitus without complications: Secondary | ICD-10-CM | POA: Diagnosis not present

## 2017-09-10 DIAGNOSIS — N390 Urinary tract infection, site not specified: Secondary | ICD-10-CM | POA: Diagnosis not present

## 2017-09-16 ENCOUNTER — Ambulatory Visit: Payer: PPO | Admitting: Physician Assistant

## 2017-09-24 NOTE — Progress Notes (Signed)
Cardiology Office Note Date:  09/25/2017  Patient ID:  Dana, [name] Mar 02, 1946, MRN [id number] PCP:  Jaclyn Shaggy, MD  Cardiologist:  Dr. Kirke Corin, MD    Chief Complaint: Follow-up  History of Present Illness: Dana [name] is a 72 y.o. female with history of coronary artery calcification noted on CTA chest from 06/2015, chronic diastolic CHF/PAH, COPD secondary to ongoing tobacco abuse, hypertension, hyperlipidemia, sinus tachycardia, venous insufficiency, diabetes, and chronic dyspnea on exertion who presents for follow-up.  Patient was previously evaluated for dyspnea on exertion and 2017 with a stress echo that was negative for wall motion abnormalities.  She has a noted history of chronic dyspnea on exertion that has been unchanged for the past several years.  Prior ABIs were normal in the setting of discoloration of her bilateral toes.  Venous Dopplers showed venous incompetence of the right saphenous femoral junction in the great saphenous vein.  There was no incompetence on the left side.  No DVT.  She was seen in the office in 12/2016, and in the setting of dyspnea on exertion and cardiac murmur noted on exam she underwent an echocardiogram that showed normal LV systolic function with near cavity obliteration in systole.  PA pressure was noted to be elevated at 67 mmHg.  She was placed on oral beta-blocker therapy at that time.  She was most recently seen in the office on 02/20/2017 and was doing well.  Her chronic dyspnea on exertion was noted to be stable.  Her sinus tachycardia had also improved with beta-blocker therapy.  She reported a prior sleep study approximately 1 year prior that was normal.  Lopressor was titrated to 50 mg twice daily.  Patient comes in doing well from a cardiac perspective today.  She does note an increase in exertional dyspnea as well as exertional fatigue with doing minimal activities such as running errands or taking a shower.  Never with  chest pain with these activities.  She relates this to feeling "drained" more so than short of breath.  She reports this has progressively gotten worse over the past several months since her office visit.  She does not check her blood pressure at home though we will frequently check it when she goes to the grocery store with readings typically in the 120s to 140s systolic over 80s to 90s diastolic.  She cannot recall what her heart rate has been running with these checks though does not recall any significantly elevated numbers.  She continues to note significant improvement in her lower extremity swelling since starting metoprolol.  She is now smoking 1 to 2 cigarettes/day and sometimes no cigarettes per day.  She reports that she does not smoke in the house and refuses to go smoke when it is excessively cold or excessively hot outdoors.  She does note some positional dizziness if she stands very quickly.  Not associated with any palpitations.  This dizziness resolves after several seconds.   Past Medical History:  Diagnosis Date  . Coronary artery calcification seen on CT scan    a. 06/2015 CTA Chest: coronary and Ao Ca2+ noted.  . Diabetes mellitus without complication (HCC)   . Diastolic dysfunction    a. 12/2016 Echo: Gr1 DD.  Marland Kitchen Dyspnea on exertion    a. 05/2015 Stress Echo: normal wall motion, nl LV fxn;  b. 12/2016 Echo: EF 60-65%, small cavity szie w/ near cavity obliteration in systole.  LVOT . No change w/ valsalva. Mild conc LVH, Gr1 DD,  mild MR, nl RV fxn, PASP .  . Fall at home    DUE TO DEHYDRATION-CARDIAC W/U NEGATIVE IN MARCH 2017 WITH DR [facility]  . Hyperlipidemia   . PAH (pulmonary artery hypertension) (HCC)    a. 12/2016 Echo: PASP .  . Sinus tachycardia    a. 06/2015 Holter: Sinus tach. No significant arrhythmias.  . Tobacco abuse   . Venous insufficiency    a. 12/2016 ABI wnl; b. 12/2016 Venous doppler: venous incompetence in R saphenous femoral jxn and the great  saphenous vein. Venous incompetence on L. No DVT.    Past Surgical History:  Procedure Laterality Date  . CARPAL TUNNEL RELEASE Right 01/18/2016   Procedure: CARPAL TUNNEL RELEASE AND GUYUNS CANAL RIGHT HAND;  Surgeon: Kennedy Bucker, MD;  Location: [facility] ORS;  Service: Orthopedics;  Laterality: Right;  . JOINT REPLACEMENT Left 2003   PARTIAL KNEE  . MUSCLE BIOPSY      Current Meds  Medication Sig  . atorvastatin (LIPITOR) 40 MG tablet Take one tab in the pm  . CRANBERRY PO Take 1 tablet by mouth as needed.   . docusate sodium (STOOL SOFTENER) 100 MG capsule Take 100 mg by mouth as needed for mild constipation.  . DULoxetine (CYMBALTA) 30 MG capsule Take 90 mg by mouth daily.   . Homeopathic Products (ALLERGY MEDICINE PO) Take 1 tablet by mouth daily.  . metFORMIN (GLUCOPHAGE) 500 MG tablet Take 1,000 mg by mouth 2 (two) times daily with a meal.   . metoprolol tartrate (LOPRESSOR) 50 MG tablet Take 1 tablet (50 mg total) by mouth 2 (two) times daily.  . [DISCONTINUED] aspirin 325 MG EC tablet Take 0.5 mg by mouth daily.     Allergies:   Erythromycin   Social History:  The patient  reports that she has been smoking cigarettes.  She has a 11.25 pack-year smoking history. She has never used smokeless tobacco. She reports that she does not drink alcohol or use drugs.   Family History:  The patient's family history includes Heart disease in her father.  ROS:   Review of Systems  Constitutional: Positive for malaise/fatigue. Negative for chills, diaphoresis, fever and weight loss.  HENT: Negative for congestion.   Eyes: Negative for discharge and redness.  Respiratory: Positive for shortness of breath. Negative for cough, hemoptysis, sputum production and wheezing.   Cardiovascular: Positive for leg swelling. Negative for chest pain, palpitations, orthopnea, claudication and PND.       Improved lower extreme swelling  Gastrointestinal: Negative for abdominal pain, blood in stool,  heartburn, melena, nausea and vomiting.  Genitourinary: Negative for hematuria.  Musculoskeletal: Negative for falls and myalgias.  Skin: Negative for rash.  Neurological: Positive for weakness. Negative for dizziness, tingling, tremors, sensory change, speech change, focal weakness and loss of consciousness.  Endo/Heme/Allergies: Does not bruise/bleed easily.  Psychiatric/Behavioral: Negative for substance abuse. The patient is not nervous/anxious.   All other systems reviewed and are negative.    PHYSICAL EXAM:  VS:  BP 135/90 (BP Location: Left Arm, Patient Position: Sitting, Cuff Size: Normal)   Pulse 89   Ht  (1.676 m)   Wt 178 lb 8 oz (81 kg)   BMI 28.81 kg/m  BMI: Body mass index is 28.81 kg/m.  Physical Exam  Constitutional: She is oriented to person, place, and time. She appears well-developed and well-nourished.  HENT:  Head: Normocephalic and atraumatic.  Eyes: Right eye exhibits no discharge. Left eye exhibits no discharge.  Neck: Normal range  of motion. No JVD present.  Cardiovascular: Normal rate, regular rhythm, S1 normal and S2 normal. Exam reveals no distant heart sounds, no friction rub, no midsystolic click and no opening snap.  Murmur heard. High-pitched blowing holosystolic murmur is present with a grade of 1/6 at the apex. Pulses:      Posterior tibial pulses are 2+ on the right side, and 2+ on the left side.  Pulmonary/Chest: Effort normal and breath sounds normal. No respiratory distress. She has no decreased breath sounds. She has no wheezes. She has no rales. She exhibits no tenderness.  Abdominal: Soft. She exhibits no distension. There is no tenderness.  Musculoskeletal: She exhibits no edema.  Chronic changes noted secondary to venous insufficiency  Neurological: She is alert and oriented to person, place, and time.  Skin: Skin is warm and dry. No cyanosis. Nails show no clubbing.  Psychiatric: She has a normal mood and affect. Her speech is  normal and behavior is normal. Judgment and thought content normal.      EKG:  Was ordered and interpreted by me today. Shows NSR, 89 bpm, no acute ST-T changes  Recent Labs: No results found for requested labs within last 8760 hours.  No results found for requested labs within last 8760 hours.   CrCl cannot be calculated (Patient's most recent lab result is older than the maximum 21 days allowed.).   Wt Readings from Last 3 Encounters:  09/25/17 178 lb 8 oz (81 kg)  02/20/17 182 lb (82.6 kg)  01/02/17 183 lb 8 oz (83.2 kg)    Orthostatic vital signs: Lying: 148/92, 89 bpm Sitting: 140/87, 90 bpm Standing: 129/83, 92 bpm, slight dizziness Standing x3 minutes: 136/88, 94 bpm  Other studies reviewed: Additional studies/records reviewed today include: summarized above  ASSESSMENT AND PLAN:  1. DOE/fatigue: Schedule Lexiscan Myoview to evaluate for high risk ischemia.  Possibly in the setting of her underlying pulmonary hypertension, possible COPD, obesity, and physical deconditioning.  Consider labs such as CBC, BMP, TSH.  Will defer to PCP.  2. PAH/chronic diastolic CHF: She appears euvolemic on exam today.  Symptoms have significantly improved since starting beta-blocker therapy.  Not on a diuretic given underlying orthostasis.  Her pulmonary hypertension is likely in setting of underlying pulmonary disease secondary to ongoing tobacco abuse.  She may benefit from a pulmonology evaluation at follow-up.  She reports a prior sleep study approximately 1.5 years ago and states this was normal.  3. Orthostasis: Orthostatic vital signs are negative in the office today however she did have a drop from 140-129 systolic from sitting to standing with associated dizziness.  Because of this, she was asked to increase her p.o. water intake and we will not escalate her beta-blocker therapy any further at this time.  4. Sinus tachycardia: Much improved since initiating beta-blocker therapy.  Unable  to escalate beta-blocker therapy further at this time given orthostasis as above.  5. Venous insufficiency: Much improved.  Continue with leg elevation.  Not on a diuretic at this time given symptoms of orthostasis.  6. Tobacco abuse: She has cut back to 1 to 2 cigarettes/day and on some days no cigarettes.  Kudos.  Complete cessation is advised.  7. Medication management: Decrease aspirin to 81 mg daily.  Disposition: F/u with myself in 4 weeks.  Current medicines are reviewed at length with the patient today.  The patient did not have any concerns regarding medicines.  Signed, Eula Listen, PA-C 09/25/2017 2:36 PM     [facility]  [address], [place] [address] [phone]

## 2017-09-25 ENCOUNTER — Ambulatory Visit: Payer: PPO | Admitting: Physician Assistant

## 2017-09-25 ENCOUNTER — Encounter: Payer: Self-pay | Admitting: Physician Assistant

## 2017-09-25 VITALS — BP 135/90 | HR 89 | Ht 66.0 in | Wt 178.5 lb

## 2017-09-25 DIAGNOSIS — R5383 Other fatigue: Secondary | ICD-10-CM | POA: Diagnosis not present

## 2017-09-25 DIAGNOSIS — Z79899 Other long term (current) drug therapy: Secondary | ICD-10-CM

## 2017-09-25 DIAGNOSIS — I951 Orthostatic hypotension: Secondary | ICD-10-CM

## 2017-09-25 DIAGNOSIS — I5032 Chronic diastolic (congestive) heart failure: Secondary | ICD-10-CM

## 2017-09-25 DIAGNOSIS — R0609 Other forms of dyspnea: Secondary | ICD-10-CM | POA: Diagnosis not present

## 2017-09-25 DIAGNOSIS — I872 Venous insufficiency (chronic) (peripheral): Secondary | ICD-10-CM

## 2017-09-25 DIAGNOSIS — I2721 Secondary pulmonary arterial hypertension: Secondary | ICD-10-CM

## 2017-09-25 DIAGNOSIS — Z720 Tobacco use: Secondary | ICD-10-CM | POA: Diagnosis not present

## 2017-09-25 DIAGNOSIS — R000 Tachycardia, unspecified: Secondary | ICD-10-CM

## 2017-09-25 MED ORDER — ASPIRIN EC 81 MG PO TBEC: 81.0000 mg | DELAYED_RELEASE_TABLET | Freq: Every day | ORAL | 3 refills | Status: AC

## 2017-09-25 NOTE — Patient Instructions (Signed)
Medication Instructions:  Your physician has recommended you make the following change in your medication:  1. DECREASE Aspirin to 81 mg once daily  Testing/Procedures: [facility] MYOVIEW  Your caregiver has ordered a Stress Test with nuclear imaging. The purpose of this test is to evaluate the blood supply to your heart muscle. This procedure is referred to as a "Non-Invasive Stress Test." This is because other than having an IV started in your vein, nothing is inserted or "invades" your body. Cardiac stress tests are done to find areas of poor blood flow to the heart by determining the extent of coronary artery disease (CAD). Some patients exercise on a treadmill, which naturally increases the blood flow to your heart, while others who are  unable to walk on a treadmill due to physical limitations have a pharmacologic/chemical stress agent called Lexiscan . This medicine will mimic walking on a treadmill by temporarily increasing your coronary blood flow.   Please note: these test may take anywhere between 2-4 hours to complete  PLEASE REPORT TO [facility] MALL ENTRANCE  THE VOLUNTEERS AT THE FIRST DESK WILL DIRECT YOU WHERE TO GO  Date of Procedure:_____________________________________  Arrival Time for Procedure:______________________________  Instructions regarding medication:   _X___ : Hold diabetes medication Metformin (Glucophage) morning of procedure  _X___:  Hold Metoprolol the night before procedure and morning of procedure   PLEASE NOTIFY THE OFFICE AT LEAST 24 HOURS IN ADVANCE IF YOU ARE UNABLE TO KEEP YOUR APPOINTMENT.  [phone] AND  PLEASE NOTIFY NUCLEAR MEDICINE AT [facility] AT LEAST 24 HOURS IN ADVANCE IF YOU ARE UNABLE TO KEEP YOUR APPOINTMENT. [phone]  How to prepare for your Myoview test:  1. Do not eat or drink after midnight 2. No caffeine for 24 hours prior to test 3. No smoking 24 hours prior to test. 4. Your medication may be taken with water.  If your  doctor stopped a medication because of this test, do not take that medication. 5. Ladies, please do not wear dresses.  Skirts or pants are appropriate. Please wear a short sleeve shirt. 6. No perfume, cologne or lotion. 7. Wear comfortable walking shoes. No heels!       Follow-Up: Your physician recommends that you schedule a follow-up appointment in: 1 month with APP or Dr. Kirke Corin.   It was a pleasure seeing you today here in the office. Please do not hesitate to give [place] a call back if you have any further questions. [phone]  Ramireno Cellar RN, BSN

## 2017-09-29 ENCOUNTER — Ambulatory Visit
Admission: RE | Admit: 2017-09-29 | Discharge: 2017-09-29 | Disposition: A | Discharge status: Home / Self Care | Payer: PPO | Source: Ambulatory Visit | Attending: Physician Assistant | Admitting: Physician Assistant

## 2017-09-29 DIAGNOSIS — R0609 Other forms of dyspnea: Secondary | ICD-10-CM | POA: Insufficient documentation

## 2017-09-29 MED ORDER — TECHNETIUM TC 99M TETROFOSMIN IV KIT
30.0600 | PACK | Freq: Once | INTRAVENOUS | Status: AC | PRN
  Administered 2017-09-29: 30.06 via INTRAVENOUS

## 2017-09-29 MED ORDER — REGADENOSON 0.4 MG/5ML IV SOLN
0.4000 mg | Freq: Once | INTRAVENOUS | Status: AC
  Administered 2017-09-29: 0.4 mg via INTRAVENOUS

## 2017-09-29 MED ORDER — TECHNETIUM TC 99M TETROFOSMIN IV KIT
13.9100 | PACK | Freq: Once | INTRAVENOUS | Status: AC | PRN
  Administered 2017-09-29: 13.91 via INTRAVENOUS

## 2017-10-01 LAB — NM MYOCAR MULTI W/SPECT W/WALL MOTION / EF
CHL CUP NUCLEAR SRS: 10
CHL CUP RESTING HR STRESS: 108 {beats}/min
CSEPED: 0 min
CSEPEW: 1 METS
CSEPHR: 89 %
CSEPPHR: 133 {beats}/min
Exercise duration (sec): 0 s
LV dias vol: 41 mL (ref 46–106)
LV sys vol: 12 mL
MPHR: 149 {beats}/min
SDS: 1
SSS: 7
TID: 0.9

## 2017-10-03 DIAGNOSIS — N390 Urinary tract infection, site not specified: Secondary | ICD-10-CM | POA: Diagnosis not present

## 2017-10-03 DIAGNOSIS — M545 Low back pain: Secondary | ICD-10-CM | POA: Diagnosis not present

## 2017-10-07 DIAGNOSIS — N390 Urinary tract infection, site not specified: Secondary | ICD-10-CM | POA: Diagnosis not present

## 2017-10-31 NOTE — Progress Notes (Signed)
Cardiology Office Note Date:  11/04/2017  Patient ID:  Dana [name], DOB [birth year], MRN [id number] PCP:  Jaclyn Shaggy, MD  Cardiologist:  Dr. Kirke Corin, MD    Chief Complaint: Follow up  History of Present Illness: Dana [name] is a 72 y.o. female with history of coronary artery calcification noted on CTA chest from 06/2015, chronic diastolic CHF/PAH, COPD secondary to ongoing tobacco abuse, hypertension, hyperlipidemia, sinus tachycardia, venous insufficiency, diabetes, unilateral carpal tunnel disease status post release, and chronic dyspnea on exertion who presents for follow-up.  Patient was previously evaluated for dyspnea on exertion and 2017 with a stress echo that was negative for wall motion abnormalities.  She has a noted history of chronic dyspnea on exertion that has been unchanged for the past several years.  Prior ABIs were normal in the setting of discoloration of her bilateral toes.  Venous Dopplers showed venous incompetence of the right saphenous femoral junction in the great saphenous vein.  There was no incompetence on the left side.  No DVT.  She was seen in the office in 12/2016, and in the setting of dyspnea on exertion and cardiac murmur noted on exam she underwent an echocardiogram that showed normal LV systolic function with near cavity obliteration in systole.  PA pressure was noted to be elevated at 67 mmHg.  She was placed on oral beta-blocker therapy at that time.  She was most recently seen in the office on 02/20/2017 and was doing well.  Her chronic dyspnea on exertion was noted to be stable.  Her sinus tachycardia had also improved with beta-blocker therapy.  She reported a prior sleep study approximately 1 year prior that was normal.  Lopressor was titrated to 50 mg twice daily.  She was seen on 5/30 for follow up and was doing well, though did note an increase in exertional dyspnea.  She underwent Lexiscan Myoview on 09/29/2017 that was low risk with an EF  > 65%.   She comes in doing well today from a cardiac perspective.  She notes stable exertional dyspnea as well as exertional fatigue with doing minimal activities such as taking a shower.  No episodes of chest pain.  She initially felt significantly improved after starting metoprolol as above though over the past several months has noted a gradual return of her "drained" feeling as well as her shortness of breath.  Blood pressure at home remains in the 120s to 130s systolic over 70s to 80s diastolic.  Unable to recall what her heart rate has been running.  No orthopnea, early satiety, PND, or lower extremity swelling.  Continues to smoke approximately 1 to 2 cigarettes/day.  Case was reviewed with the patient's primary cardiologist with concern for possible cardiac amyloid.  Past Medical History:  Diagnosis Date  . Coronary artery calcification seen on CT scan    a. 06/2015 CTA Chest: coronary and Ao Ca2+ noted.  . Diabetes mellitus without complication (HCC)   . Diastolic dysfunction    a. 12/2016 Echo: Gr1 DD.  Marland Kitchen Dyspnea on exertion    a. 05/2015 Stress Echo: normal wall motion, nl LV fxn;  b. 12/2016 Echo: EF 60-65%, small cavity szie w/ near cavity obliteration in systole.  LVOT . No change w/ valsalva. Mild conc LVH, Gr1 DD, mild MR, nl RV fxn, PASP .  . Fall at home    DUE TO DEHYDRATION-CARDIAC W/U NEGATIVE IN MARCH 2017 WITH DR [facility]  . Hyperlipidemia   . PAH (pulmonary artery hypertension) (HCC)  a. 12/2016 Echo: PASP .  . Sinus tachycardia    a. 06/2015 Holter: Sinus tach. No significant arrhythmias.  . Tobacco abuse   . Venous insufficiency    a. 12/2016 ABI wnl; b. 12/2016 Venous doppler: venous incompetence in R saphenous femoral jxn and the great saphenous vein. Venous incompetence on L. No DVT.    Past Surgical History:  Procedure Laterality Date  . CARPAL TUNNEL RELEASE Right 01/18/2016   Procedure: CARPAL TUNNEL RELEASE AND GUYUNS CANAL RIGHT HAND;  Surgeon:  Kennedy Bucker, MD;  Location: [facility] ORS;  Service: Orthopedics;  Laterality: Right;  . JOINT REPLACEMENT Left 2003   PARTIAL KNEE  . MUSCLE BIOPSY      Current Meds  Medication Sig  . aspirin EC 81 MG tablet Take 1 tablet (81 mg total) by mouth daily.  Marland Kitchen atorvastatin (LIPITOR) 40 MG tablet Take one tab in the pm  . CRANBERRY PO Take 1 tablet by mouth as needed.   . docusate sodium (STOOL SOFTENER) 100 MG capsule Take 100 mg by mouth as needed for mild constipation.  . DULoxetine (CYMBALTA) 30 MG capsule Take 90 mg by mouth daily.   . Homeopathic Products (ALLERGY MEDICINE PO) Take 1 tablet by mouth daily.  . metFORMIN (GLUCOPHAGE) 500 MG tablet Take 1,000 mg by mouth 2 (two) times daily with a meal.   . metoprolol tartrate (LOPRESSOR) 50 MG tablet Take 1 tablet (50 mg total) by mouth 2 (two) times daily.    Allergies:   Erythromycin   Social History:  The patient  reports that she has been smoking cigarettes.  She has a 11.25 pack-year smoking history. She has never used smokeless tobacco. She reports that she does not drink alcohol or use drugs.   Family History:  The patient's family history includes Heart disease in her father.  ROS:   Review of Systems  Constitutional: Positive for diaphoresis and malaise/fatigue. Negative for chills, fever and weight loss.  HENT: Negative for congestion.   Eyes: Negative for discharge and redness.  Respiratory: Positive for shortness of breath. Negative for cough, hemoptysis, sputum production and wheezing.   Cardiovascular: Negative for chest pain, palpitations, orthopnea, claudication, leg swelling and PND.  Gastrointestinal: Negative for abdominal pain, blood in stool, heartburn, melena, nausea and vomiting.  Genitourinary: Negative for hematuria.  Musculoskeletal: Negative for falls and myalgias.  Skin: Negative for rash.  Neurological: Positive for weakness. Negative for dizziness, tingling, tremors, sensory change, speech change, focal  weakness and loss of consciousness.  Endo/Heme/Allergies: Does not bruise/bleed easily.  Psychiatric/Behavioral: Negative for substance abuse. The patient is not nervous/anxious.   All other systems reviewed and are negative.    PHYSICAL EXAM:  VS:  BP 110/80 (BP Location: Left Arm, Patient Position: Sitting, Cuff Size: Normal)   Pulse 90   Ht 5\' 6"  (1.676 m)   Wt 173 lb 8 oz (78.7 kg)   BMI 28.00 kg/m  BMI: Body mass index is 28 kg/m.  Physical Exam  Constitutional: She is oriented to person, place, and time. She appears well-developed and well-nourished.  HENT:  Head: Normocephalic and atraumatic.  Eyes: Right eye exhibits no discharge. Left eye exhibits no discharge.  Neck: Normal range of motion. No JVD present.  Cardiovascular: Normal rate, regular rhythm, S1 normal and S2 normal. Exam reveals no distant heart sounds, no friction rub, no midsystolic click and no opening snap.  Murmur heard. High-pitched blowing holosystolic murmur is present with a grade of 1/6 at the apex. Pulses:  Posterior tibial pulses are 2+ on the right side, and 2+ on the left side.  Pulmonary/Chest: Effort normal and breath sounds normal. No respiratory distress. She has no decreased breath sounds. She has no wheezes. She has no rales. She exhibits no tenderness.  Abdominal: Soft. She exhibits no distension. There is no tenderness.  Musculoskeletal: She exhibits no edema.  Neurological: She is alert and oriented to person, place, and time.  Skin: Skin is warm and dry. No cyanosis. Nails show no clubbing.  Psychiatric: She has a normal mood and affect. Her speech is normal and behavior is normal. Judgment and thought content normal.     EKG:  Was ordered and interpreted by me today. Shows NSR, 90 bpm, possible prior septal infarct, nonspecific st/t changes  Recent Labs: No results found for requested labs within last 8760 hours.  No results found for requested labs within last 8760 hours.    CrCl cannot be calculated (Patient's most recent lab result is older than the maximum 21 days allowed.).   Wt Readings from Last 3 Encounters:  11/04/17 173 lb 8 oz (78.7 kg)  09/25/17 178 lb 8 oz (81 kg)  02/20/17 182 lb (82.6 kg)     Other studies reviewed: Additional studies/records reviewed today include: summarized above  ASSESSMENT AND PLAN:  1. PAH/chronic diastolic CHF/DOE/fatigue: Ischemic evaluation via Lexiscan Myoview has been unrevealing for high risk ischemia.  Recent echocardiogram demonstrated preserved LV systolic function with near complete obliteration of the LV cavity in systole as well as diastolic heart failure and pulmonary hypertension.  Case was discussed with the patient's primary cardiologist with recommendation to proceed with further cardiac imaging.  We will proceed with a cardiac MRI to evaluate for any potential infiltrative disease playing a role in her symptomology.  Should this be abnormal, we would refer [place] to [place] advanced [place].  If the cardiac MRI is unrevealing we will plan to refer her to pulmonology.  2. Sinus tachycardia: Much improved since initiating beta-blocker therapy.  Increase Lopressor to 75 mg twice daily.  3. Orthostasis: Resolved.  Escalate beta-blocker as above.  4. Venous insufficiency: Resolved.  Continue with leg elevation.  Not on diuretic at this time.  5. Tobacco abuse: Complete cessation is advised.  Disposition: F/u with Dr. Kirke CorinArida or APP after cardiac MRI   Current medicines are reviewed at length with the patient today.  The patient did not have any concerns regarding medicines.  Signed, Eula Listenyan Lakera Viall, PA-C 11/04/2017 2:05 PM     [facility] [place] [address] [place], [place] [address] [phone]

## 2017-11-04 ENCOUNTER — Telehealth: Payer: Self-pay | Admitting: Physician Assistant

## 2017-11-04 ENCOUNTER — Ambulatory Visit: Payer: PPO | Admitting: Physician Assistant

## 2017-11-04 ENCOUNTER — Encounter: Payer: Self-pay | Admitting: Physician Assistant

## 2017-11-04 VITALS — BP 110/80 | HR 90 | Ht 66.0 in | Wt 173.5 lb

## 2017-11-04 DIAGNOSIS — I2721 Secondary pulmonary arterial hypertension: Secondary | ICD-10-CM | POA: Diagnosis not present

## 2017-11-04 DIAGNOSIS — I5032 Chronic diastolic (congestive) heart failure: Secondary | ICD-10-CM

## 2017-11-04 DIAGNOSIS — R5383 Other fatigue: Secondary | ICD-10-CM

## 2017-11-04 DIAGNOSIS — I951 Orthostatic hypotension: Secondary | ICD-10-CM | POA: Diagnosis not present

## 2017-11-04 DIAGNOSIS — R000 Tachycardia, unspecified: Secondary | ICD-10-CM | POA: Diagnosis not present

## 2017-11-04 DIAGNOSIS — Z720 Tobacco use: Secondary | ICD-10-CM

## 2017-11-04 DIAGNOSIS — I872 Venous insufficiency (chronic) (peripheral): Secondary | ICD-10-CM

## 2017-11-04 DIAGNOSIS — R0609 Other forms of dyspnea: Secondary | ICD-10-CM

## 2017-11-04 MED ORDER — METOPROLOL TARTRATE 50 MG PO TABS: 75.0000 mg | ORAL_TABLET | Freq: Two times a day (BID) | ORAL | 3 refills | Status: DC

## 2017-11-04 NOTE — Patient Instructions (Signed)
Medication Instructions:  Your physician recommends that you continue on your current medications as directed. Please refer to the Current Medication list given to you today.   Labwork: Your physician recommends that you return for lab work at WITHIN A WEEK OR TWO OF THE MRI ONCE IT IS SCHEDULED.  - Please go to the [facility]. You will check in at the front desk to the right as you walk into the atrium. Valet Parking is offered if needed.    Testing/Procedures: Your physician has requested that you have a cardiac MRI. Cardiac MRI uses a computer to create images of your heart as its beating, producing both still and moving pictures of your heart and major blood vessels. For further information please visit [email or web address]. Please follow the instruction sheet given to you today for more information.    Follow-Up: Your physician recommends that you schedule a follow-up appointment in: 3-4 MONTHS WITH DR ARIDA OR APP.  If you need a refill on your cardiac medications before your next appointment, please call your pharmacy.

## 2017-11-04 NOTE — Telephone Encounter (Signed)
Called patient and LVM to call me with the time she would like her cardiac MRI scheduled.

## 2017-11-06 ENCOUNTER — Encounter: Payer: Self-pay | Admitting: Physician Assistant

## 2017-11-10 ENCOUNTER — Telehealth: Payer: Self-pay | Admitting: Physician Assistant

## 2017-11-10 NOTE — Telephone Encounter (Signed)
lmov to call office.

## 2017-11-10 NOTE — Telephone Encounter (Signed)
-----   Message from Joline MaxcyJennifer B Harkins sent at 11/04/2017  2:37 PM EDT ----- Regarding: Checkout  Patient to call when mri scheduled . Needs 1-2 week after that for labs .    Patient also needs appt with Alycia Rossettiyan in November recall entered schedule not out.

## 2017-11-11 NOTE — Telephone Encounter (Signed)
Pt scheduled for labs and MRI   Nothing else needed.

## 2017-11-14 DIAGNOSIS — N390 Urinary tract infection, site not specified: Secondary | ICD-10-CM | POA: Diagnosis not present

## 2017-11-18 ENCOUNTER — Ambulatory Visit (HOSPITAL_COMMUNITY)
Admission: RE | Admit: 2017-11-18 | Discharge: 2017-11-18 | Disposition: A | Discharge status: Home / Self Care | Payer: PPO | Source: Ambulatory Visit | Attending: Physician Assistant | Admitting: Physician Assistant

## 2017-11-18 DIAGNOSIS — I340 Nonrheumatic mitral (valve) insufficiency: Secondary | ICD-10-CM | POA: Diagnosis not present

## 2017-11-18 DIAGNOSIS — I509 Heart failure, unspecified: Secondary | ICD-10-CM

## 2017-11-18 DIAGNOSIS — I2721 Secondary pulmonary arterial hypertension: Secondary | ICD-10-CM | POA: Diagnosis not present

## 2017-11-18 DIAGNOSIS — I272 Pulmonary hypertension, unspecified: Secondary | ICD-10-CM

## 2017-11-18 LAB — POCT I-STAT CREATININE: CREATININE: 0.6 mg/dL (ref 0.44–1.00)

## 2017-11-18 MED ORDER — GADOBENATE DIMEGLUMINE 529 MG/ML IV SOLN
30.0000 mL | Freq: Once | INTRAVENOUS | Status: AC
  Administered 2017-11-18: 30 mL via INTRAVENOUS

## 2017-12-05 ENCOUNTER — Other Ambulatory Visit: Payer: Self-pay | Admitting: *Deleted

## 2017-12-05 ENCOUNTER — Other Ambulatory Visit (INDEPENDENT_AMBULATORY_CARE_PROVIDER_SITE_OTHER): Payer: PPO

## 2017-12-05 DIAGNOSIS — I5032 Chronic diastolic (congestive) heart failure: Secondary | ICD-10-CM | POA: Diagnosis not present

## 2017-12-05 DIAGNOSIS — R0609 Other forms of dyspnea: Secondary | ICD-10-CM

## 2017-12-05 DIAGNOSIS — I2721 Secondary pulmonary arterial hypertension: Secondary | ICD-10-CM

## 2017-12-06 LAB — BASIC METABOLIC PANEL
BUN/Creatinine Ratio: 15 (ref 12–28)
BUN: 12 mg/dL (ref 8–27)
CHLORIDE: 101 mmol/L (ref 96–106)
CO2: 20 mmol/L (ref 20–29)
Calcium: 9.4 mg/dL (ref 8.7–10.3)
Creatinine, Ser: 0.81 mg/dL (ref 0.57–1.00)
GFR calc non Af Amer: 73 mL/min/{1.73_m2} (ref >59)
GFR, EST AFRICAN AMERICAN: 85 mL/min/{1.73_m2} (ref >59)
Glucose: 105 mg/dL — ABNORMAL HIGH (ref 65–99)
POTASSIUM: 4.7 mmol/L (ref 3.5–5.2)
Sodium: 141 mmol/L (ref 134–144)

## 2017-12-30 DIAGNOSIS — N3000 Acute cystitis without hematuria: Secondary | ICD-10-CM | POA: Diagnosis not present

## 2018-01-16 DIAGNOSIS — E785 Hyperlipidemia, unspecified: Secondary | ICD-10-CM | POA: Diagnosis not present

## 2018-01-16 DIAGNOSIS — E119 Type 2 diabetes mellitus without complications: Secondary | ICD-10-CM | POA: Diagnosis not present

## 2018-01-21 DIAGNOSIS — E119 Type 2 diabetes mellitus without complications: Secondary | ICD-10-CM | POA: Diagnosis not present

## 2018-01-21 DIAGNOSIS — N390 Urinary tract infection, site not specified: Secondary | ICD-10-CM | POA: Diagnosis not present

## 2018-01-21 DIAGNOSIS — E785 Hyperlipidemia, unspecified: Secondary | ICD-10-CM | POA: Diagnosis not present

## 2018-04-30 ENCOUNTER — Other Ambulatory Visit: Payer: Self-pay | Admitting: Cardiovascular Disease

## 2018-05-27 DIAGNOSIS — E785 Hyperlipidemia, unspecified: Secondary | ICD-10-CM | POA: Diagnosis not present

## 2018-05-27 DIAGNOSIS — E119 Type 2 diabetes mellitus without complications: Secondary | ICD-10-CM | POA: Diagnosis not present

## 2018-06-02 DIAGNOSIS — E785 Hyperlipidemia, unspecified: Secondary | ICD-10-CM | POA: Diagnosis not present

## 2018-06-02 DIAGNOSIS — E119 Type 2 diabetes mellitus without complications: Secondary | ICD-10-CM | POA: Diagnosis not present

## 2018-08-24 ENCOUNTER — Encounter: Payer: Self-pay | Admitting: Physician Assistant

## 2018-08-24 ENCOUNTER — Telehealth (INDEPENDENT_AMBULATORY_CARE_PROVIDER_SITE_OTHER): Payer: PPO | Admitting: Physician Assistant

## 2018-08-24 ENCOUNTER — Telehealth: Payer: Self-pay

## 2018-08-24 VITALS — Ht 66.0 in | Wt 188.0 lb

## 2018-08-24 DIAGNOSIS — R5383 Other fatigue: Secondary | ICD-10-CM

## 2018-08-24 DIAGNOSIS — R000 Tachycardia, unspecified: Secondary | ICD-10-CM

## 2018-08-24 DIAGNOSIS — I2721 Secondary pulmonary arterial hypertension: Secondary | ICD-10-CM

## 2018-08-24 DIAGNOSIS — Z720 Tobacco use: Secondary | ICD-10-CM

## 2018-08-24 DIAGNOSIS — I5032 Chronic diastolic (congestive) heart failure: Secondary | ICD-10-CM

## 2018-08-24 DIAGNOSIS — R0609 Other forms of dyspnea: Secondary | ICD-10-CM

## 2018-08-24 DIAGNOSIS — I872 Venous insufficiency (chronic) (peripheral): Secondary | ICD-10-CM

## 2018-08-24 NOTE — Addendum Note (Signed)
Addended by: Jarvis Newcomer on: 08/24/2018 04:33 PM   Modules accepted: Orders

## 2018-08-24 NOTE — Progress Notes (Signed)
Virtual Visit via Telephone Note   This visit type was conducted due to national recommendations for restrictions regarding the COVID-19 Pandemic (e.g. social distancing) in an effort to limit this patient's exposure and mitigate transmission in our community.  Due to her co-morbid illnesses, this patient is at least at moderate risk for complications without adequate follow up.  This format is felt to be most appropriate for this patient at this time.  The patient did not have access to video technology/had technical difficulties with video requiring transitioning to audio format only (telephone).  All issues noted in this document were discussed and addressed.  No physical exam could be performed with this format.  Please refer to the patient's chart for her  consent to [facility]health [facility].   Evaluation Performed:  Follow-up visit  Date:  08/24/2018   ID:  Dana [name], DOB 09-04-1945, MRN [id number]  Patient Location: Home Provider Location: Home  PCP:  Jaclyn Shaggyate, Denny C, MD  Cardiologist:  Lorine BearsMuhammad Arida, MD  Electrophysiologist:  None   Chief Complaint:  Telehealth follow up  History of Present Illness:    Dana [name] is a 73 y.o. female with coronary artery calcification noted on CTA chest from 06/2015, chronic diastolic CHF/PAH, COPD secondary to ongoing tobacco abuse, hypertension, hyperlipidemia, sinus tachycardia, venous insufficiency, diabetes, unilateral carpal tunnel disease status post release, and chronic dyspnea on exertion who presents for telehealth follow-up.   Patient was previously evaluated for dyspnea on exertion and 2017 with a stress echo that was negative for wall motion abnormalities.  She has a noted history of chronic dyspnea on exertion that has been unchanged for the past several years.  Prior ABIs were normal in the setting of discoloration of her bilateral toes.  Venous Dopplers showed venous incompetence of the right saphenous femoral  junction in the great saphenous vein.  There was no incompetence on the left side.  No DVT.  She was seen in the office in 12/2016, and in the setting of dyspnea on exertion and cardiac murmur noted on exam she underwent an echocardiogram that showed normal LV systolic function with near cavity obliteration in systole.  PA pressure was noted to be elevated at 67 mmHg.  She was placed on oral beta-blocker therapy at that time.  She was seen in the office on 02/20/2017 and was doing well.  Her chronic dyspnea on exertion was noted to be stable.  Her sinus tachycardia had also improved with beta-blocker therapy.  She reported a prior sleep study approximately 1 year prior that was normal.  Lopressor was titrated to 50 mg twice daily.  She was seen on 08/3017 for follow up and was doing well, though did note an increase in exertional dyspnea.  She underwent Lexiscan Myoview on 09/29/2017 that was low risk with an EF > 65%.  She was most recently seen in the office in 10/2017 for follow up and was doing well with stable exertional dyspnea.  She underwent cardiac MRI that showed no evidence of infiltrative disease, prior MI, or myocarditis with an EF of 58% without LVH.  Her Lopressor was titrated to 75 mg bid.  She continued to smoke.    Labs: 11/2017 - K+ 4.7, SCr 0.81  In follow up today, she is doing well. She continues to note stable, to possibly slightly worse exertional dyspnea and fatigue with activities such as baking or taking a shower. She denies any chest pain with these activities. No palpitations, dizziness, presyncope,  or syncope. She has stable 2-pillow orthopnea. No lower extremity swelling, abdominal distension, or early satiety. She does not really watch her salt intake, though does not add salt to foods. She has cut back on her tobacco use to 1-2 cigarettes per week.    The patient does not have symptoms concerning for COVID-19 infection (fever, chills, cough, or new shortness of breath).    Past  Medical History:  Diagnosis Date  . Coronary artery calcification seen on CT scan    a. 06/2015 CTA Chest: coronary and Ao Ca2+ noted.  . Diabetes mellitus without complication (HCC)   . Diastolic dysfunction    a. 12/2016 Echo: Gr1 DD.  Marland Kitchen Dyspnea on exertion    a. 05/2015 Stress Echo: normal wall motion, nl LV fxn;  b. 12/2016 Echo: EF 60-65%, small cavity szie w/ near cavity obliteration in systole.  LVOT . No change w/ valsalva. Mild conc LVH, Gr1 DD, mild MR, nl RV fxn, PASP .  . Fall at home    DUE TO DEHYDRATION-CARDIAC W/U NEGATIVE IN MARCH 2017 WITH DR [facility]  . Hyperlipidemia   . PAH (pulmonary artery hypertension) (HCC)    a. 12/2016 Echo: PASP .  . Sinus tachycardia    a. 06/2015 Holter: Sinus tach. No significant arrhythmias.  . Tobacco abuse   . Venous insufficiency    a. 12/2016 ABI wnl; b. 12/2016 Venous doppler: venous incompetence in R saphenous femoral jxn and the great saphenous vein. Venous incompetence on L. No DVT.   Past Surgical History:  Procedure Laterality Date  . CARPAL TUNNEL RELEASE Right 01/18/2016   Procedure: CARPAL TUNNEL RELEASE AND GUYUNS CANAL RIGHT HAND;  Surgeon: Kennedy Bucker, MD;  Location: [facility] ORS;  Service: Orthopedics;  Laterality: Right;  . JOINT REPLACEMENT Left 2003   PARTIAL KNEE  . MUSCLE BIOPSY       Current Meds  Medication Sig  . aspirin EC 81 MG tablet Take 1 tablet (81 mg total) by mouth daily.  Marland Kitchen atorvastatin (LIPITOR) 40 MG tablet Take one tab in the pm  . CRANBERRY PO Take 1 tablet by mouth as needed.   . docusate sodium (STOOL SOFTENER) 100 MG capsule Take 100 mg by mouth as needed for mild constipation.  . DULoxetine (CYMBALTA) 30 MG capsule Take 90 mg by mouth daily.   . Homeopathic Products (ALLERGY MEDICINE PO) Take 1 tablet by mouth daily.  . metFORMIN (GLUCOPHAGE) 500 MG tablet Take 1,000 mg by mouth 2 (two) times daily with a meal.   . metoprolol tartrate (LOPRESSOR) 50 MG tablet Take 1.5 tablets (75 mg  total) by mouth 2 (two) times daily.     Allergies:   Erythromycin   Social History   Tobacco Use  . Smoking status: Current Some Day Smoker    Packs/day: 0.25    Years: 45.00    Pack years: 11.25    Types: Cigarettes  . Smokeless tobacco: Never Used  . Tobacco comment: 1-2 CIGARETTES EVERY OTHER DAY  Substance Use Topics  . Alcohol use: No    Comment: RARE  . Drug use: No     Family Hx: The patient's family history includes Heart disease in her father.  ROS:   Please see the history of present illness.     All other systems reviewed and are negative.   Prior CV studies:   The following studies were reviewed today:  2D Echo 12/2016: - Left ventricle: The cavity size was small, near cavity  obliteration in systole. LVOT measured, 67 mm Hg, no significant change with valsalva. There was mild concentric hypertrophy. Systolic function was normal. The estimated ejection fraction was in the range of 60% to 65%. Wall motion was normal; there were no regional wall motion abnormalities. Doppler parameters are consistent with abnormal left ventricular relaxation (grade 1 diastolic dysfunction). - Mitral valve: There was mild regurgitation. - Left atrium: The atrium was normal in size. - Right ventricle: Systolic function was normal. - Pulmonary arteries: Systolic pressure could not be accurately   estimated. PA peak pressure: 67 mm Hg (S). __________   Myoview 09/2017: There was no ST segment deviation noted during stress.  The study is normal.  This is a low risk study.  The left ventricular ejection fraction is hyperdynamic (>65%). __________  Cardiac MRI 10/2017: 1. Normal LV size and systolic function, EF 58%. No LV hypertrophy. The LV does not appear hyperdynamic. 2.  Normal RV size and systolic function, EF 53%. 3.  Mild mitral regurgitation. 4. No myocardial LGE, so no definitive evidence for prior MI, infiltrative disease, or myocarditis.  Labs/Other Tests and  Data Reviewed:    EKG:  An ECG dated 09/25/2017 was personally reviewed today and demonstrated:  NSR, 89 bpm, no acute st/t changes  Recent Labs: 12/05/2017: BUN 12; Creatinine, Ser 0.81; Potassium 4.7; Sodium 141   Recent Lipid Panel No results found for: CHOL, TRIG, HDL, CHOLHDL, LDLCALC, LDLDIRECT  Wt Readings from Last 3 Encounters:  08/24/18 188 lb (85.3 kg)  11/04/17 173 lb 8 oz (78.7 kg)  09/25/17 178 lb 8 oz (81 kg)     Objective:    Vital Signs:  Ht  (1.676 m)   Wt 188 lb (85.3 kg) Comment: Estimated by patient  BMI 30.34 kg/m    VITAL SIGNS:  reviewed  ASSESSMENT & PLAN:    1. PAH/dyspnea: Possibly in the setting of ongoing tobacco abuse. Schedule echo for later this summer, pending the COVID-19 pandemic, to assess diastolic function and PASP. Referral to pulmonology for further evaluation and treatment of her dyspnea and PAH. Self reported prior sleep study was normal per report. Complete cessation of tobacco is advised.   2. HFpEF: She denies any symptoms of volume overload or decompensation and has not required a standing diuretic for her diastolic dysfunction. Continue Lopressor. Optimal BP control advised. CHF education.   3. Venous insufficiency: Stable. Continued leg elevation.   4. Sinus tachycardia: Much improved following titration of Lopressor.   5. Tobacco abuse: Now down to 1-2 cigarettes per week. Complete cessation is recommended. She was commended on tapering her use.   COVID-19 Education: The signs and symptoms of COVID-19 were discussed with the patient and how to seek care for testing (follow up with PCP or arrange E-visit).  The importance of social distancing was discussed today.  Time:   Today, I have spent 15 minutes with the patient with telehealth technology discussing the above problems.     Medication Adjustments/Labs and Tests Ordered: Current medicines are reviewed at length with the patient today.  Concerns regarding medicines are  outlined above.   Tests Ordered: No orders of the defined types were placed in this encounter.   Medication Changes: No orders of the defined types were placed in this encounter.   Disposition:  Follow up in 6 month(s)  Signed, Eula Listen, PA-C  08/24/2018 3:49 PM    [facility]

## 2018-08-24 NOTE — Patient Instructions (Signed)
Medication Instructions:  Your physician recommends that you continue on your current medications as directed. Please refer to the Current Medication list given to you today.  If you need a refill on your cardiac medications before your next appointment, please call your pharmacy.   Lab work: None ordered If you have labs (blood work) drawn today and your tests are completely normal, you will receive your results only by: Marland Kitchen MyChart Message (if you have MyChart) OR . A paper copy in the mail If you have any lab test that is abnormal or we need to change your treatment, we will call you to review the results.  Testing/Procedures: Your physician has requested that you have an echocardiogram. Echocardiography is a painless test that uses sound waves to create images of your heart. It provides your doctor with information about the size and shape of your heart and how well your heart's chambers and valves are working. This procedure takes approximately one hour. There are no restrictions for this procedure. (To be scheduled in July/August 2020)  Follow-Up: At [facility], you and your health needs are our priority.  As part of our continuing mission to provide you with exceptional heart care, we have created designated Provider Care Teams.  These Care Teams include your primary Cardiologist (physician) and Advanced Practice Providers (APPs -  Physician Assistants and Nurse Practitioners) who all work together to provide you with the care you need, when you need it. You will need a follow up appointment in 6 months.  Please call our office 2 months in advance to schedule this appointment.  You may see Lorine Bears, MD or one of the following Advanced Practice Providers on your designated Care Team:   Nicolasa Ducking, NP Eula Listen, PA-C . Marisue Ivan, PA-C  Any Other Special Instructions Will Be Listed Below (If Applicable). You have been referred to [facility]ology their office will  contact you directly to schedule.

## 2018-08-24 NOTE — Telephone Encounter (Signed)
Virtual Visit Pre-Appointment Phone Call  "(Name), I am calling you today to discuss your upcoming appointment. We are currently trying to limit exposure to the virus that causes COVID-19 by seeing patients at home rather than in the office."  1. "What is the BEST phone number to call the day of the visit?" - include this in appointment notes  2. Do you have or have access to (through a family member/friend) a smartphone with video capability that we can use for your visit?" a. If yes - list this number in appt notes as cell (if different from BEST phone #) and list the appointment type as a VIDEO visit in appointment notes b. If no - list the appointment type as a PHONE visit in appointment notes  3. Confirm consent - "In the setting of the current Covid19 crisis, you are scheduled for a phone visit with your provider on August 24, 2018 at 3:30PM.  Just as we do with many in-office visits, in order for you to participate in this visit, we must obtain consent.  If you'd like, I can send this to your mychart (if signed up) or email for you to review.  Otherwise, I can obtain your verbal consent now.  All virtual visits are billed to your insurance company just like a normal visit would be.  By agreeing to a virtual visit, we'd like you to understand that the technology does not allow for your provider to perform an examination, and thus may limit your provider's ability to fully assess your condition. If your provider identifies any concerns that need to be evaluated in person, we will make arrangements to do so.  Finally, though the technology is pretty good, we cannot assure that it will always work on either your or our end, and in the setting of a video visit, we may have to convert it to a phone-only visit.  In either situation, we cannot ensure that we have a secure connection.  Are you willing to proceed?" STAFF: Did the patient verbally acknowledge consent to telehealth visit? Document  YES/NO here: YES  4. Advise patient to be prepared - "Two hours prior to your appointment, go ahead and check your blood pressure, pulse, oxygen saturation, and your weight (if you have the equipment to check those) and write them all down. When your visit starts, your provider will ask you for this information. If you have an Apple Watch or Kardia device, please plan to have heart rate information ready on the day of your appointment. Please have a pen and paper handy nearby the day of the visit as well."  5. Give patient instructions for MyChart download to smartphone OR [email or web address] as below if video visit (depending on what platform provider is using)  6. Inform patient they will receive a phone call 15 minutes prior to their appointment time (may be from unknown caller ID) so they should be prepared to answer    TELEPHONE CALL NOTE  Shakeisha A Wann has been deemed a candidate for a follow-up tele-health visit to limit community exposure during the Covid-19 pandemic. I spoke with the patient via phone to ensure availability of phone/video source, confirm preferred email & phone number, and discuss instructions and expectations.  I reminded Dana [name] to be prepared with any vital sign and/or heart rhythm information that could potentially be obtained via home monitoring, at the time of her visit. I reminded Dana [name] to expect a phone call prior to  her visit.  Dana [name] 08/24/2018 12:05 PM    FULL LENGTH CONSENT FOR TELE-HEALTH VISIT   I hereby voluntarily request, consent and authorize [facility] and its employed or contracted physicians, physician assistants, nurse practitioners or other licensed health care professionals (the Practitioner), to provide me with telemedicine health care services (the Services") as deemed necessary by the treating Practitioner. I acknowledge and consent to receive the Services by the Practitioner via  telemedicine. I understand that the telemedicine visit will involve communicating with the Practitioner through live audiovisual communication technology and the disclosure of certain medical information by electronic transmission. I acknowledge that I have been given the opportunity to request an in-person assessment or other available alternative prior to the telemedicine visit and am voluntarily participating in the telemedicine visit.  I understand that I have the right to withhold or withdraw my consent to the use of telemedicine in the course of my care at any time, without affecting my right to future care or treatment, and that the Practitioner or I may terminate the telemedicine visit at any time. I understand that I have the right to inspect all information obtained and/or recorded in the course of the telemedicine visit and may receive copies of available information for a reasonable fee.  I understand that some of the potential risks of receiving the Services via telemedicine include:   Delay or interruption in medical evaluation due to technological equipment failure or disruption;  Information transmitted may not be sufficient (e.g. poor resolution of images) to allow for appropriate medical decision making by the Practitioner; and/or   In rare instances, security protocols could fail, causing a breach of personal health information.  Furthermore, I acknowledge that it is my responsibility to provide information about my medical history, conditions and care that is complete and accurate to the best of my ability. I acknowledge that Practitioner's advice, recommendations, and/or decision may be based on factors not within their control, such as incomplete or inaccurate data provided by me or distortions of diagnostic images or specimens that may result from electronic transmissions. I understand that the practice of medicine is not an exact science and that Practitioner makes no warranties or  guarantees regarding treatment outcomes. I acknowledge that I will receive a copy of this consent concurrently upon execution via email to the email address I last provided but may also request a printed copy by calling the office of [facility].    I understand that my insurance will be billed for this visit.   I have read or had this consent read to me.  I understand the contents of this consent, which adequately explains the benefits and risks of the Services being provided via telemedicine.   I have been provided ample opportunity to ask questions regarding this consent and the Services and have had my questions answered to my satisfaction.  I give my informed consent for the services to be provided through the use of telemedicine in my medical care  By participating in this telemedicine visit I agree to the above.

## 2018-08-27 ENCOUNTER — Ambulatory Visit (INDEPENDENT_AMBULATORY_CARE_PROVIDER_SITE_OTHER): Payer: PPO | Admitting: Internal Medicine

## 2018-08-27 ENCOUNTER — Encounter: Payer: Self-pay | Admitting: Internal Medicine

## 2018-08-27 DIAGNOSIS — F17210 Nicotine dependence, cigarettes, uncomplicated: Secondary | ICD-10-CM

## 2018-08-27 DIAGNOSIS — Z720 Tobacco use: Secondary | ICD-10-CM

## 2018-08-27 DIAGNOSIS — J449 Chronic obstructive pulmonary disease, unspecified: Secondary | ICD-10-CM | POA: Diagnosis not present

## 2018-08-27 NOTE — Patient Instructions (Signed)
Strong suspicion for COPD  Obtain PFT's and when available Obtain ONO to assess for nocturnal hypoxia  Lung Cancer screening referral

## 2018-08-27 NOTE — Addendum Note (Signed)
Addended by: Maxwell Marion A on: 08/27/2018 03:56 PM   Modules accepted: Orders

## 2018-08-27 NOTE — Progress Notes (Signed)
VIDEO/TELEPHONE VISIT    In the setting of the current Covid19 crisis, you are scheduled for a  visit with me on 08/27/18  Just as we do with many in-office visits, in order for you to participate in this visit, we must obtain consent.   I can obtain your verbal consent now.  PATIENT AGREES AND CONFIRMS -YES This Visit has Audio and Visual Capabilities for optimal patient care experience   Evaluation Performed:  CONSULT  This visit type was conducted due to national recommendations for restrictions regarding the COVID-19 Pandemic (e.g. social distancing).  This format is felt to be most appropriate for this patient at this time.  All issues noted in this document were discussed and addressed.  No physical exam was performed (except for noted visual exam findings with Telehealth visits).  See MyChart message from today for the patient's consent to telehealth for [facility] PULMONARY   Virtual Visit via Telephone Note   I connected with patient on 08/27/2018  by telephone and verified that I am speaking with the correct person using two identifiers.   I discussed the limitations, risks, security and privacy concerns of performing an evaluation and management service by telephone and the availability of in person appointments. I also discussed with the patient that there may be a patient responsible charge related to this service. The patient expressed understanding and agreed to proceed.   Location of the patient: Home Location of provider: Home Participating persons: Patient and provider only    Date:  08/27/2018   ID:  Dana [name], DOB [birth year], MRN [id number]  Patient Location:  [address] [place] [address]   Provider location:   [place] [facility]  PCP:  Jaclyn Shaggy, MD   Chief Complaint:   SOB   History of Present Illness:    Dana [name] is a 73 y.o. female who presents via audio/video conferencing for a telehealth  visit today.   The patient does not symptoms concerning for COVID-19 infection (fever, chills, cough, or new SHORTNESS OF BREATH).   SOB and DOE for many years worsening last several years Dx with Pneumonia 5 years ago No wheezing No cough  Referred by cardiology for diastolic heart failure preserved EF  Stress TEST and cardiac MRI NEG ECHO high PA pressures        Smoking Assessment and Cessation Counseling   Upon further questioning, Patient smokes 1/2 PPD  I have advised patient to quit/stop smoking as soon as possible due to high risk for multiple medical problems  Patient is willing to quit smoking  I have advised patient that we can assist and have options of Nicotine replacement therapy. I also advised patient on behavioral therapy and can provide oral medication therapy in conjunction with the other therapies  Follow up next Office visit  for assessment of smoking cessation  Smoking cessation counseling advised for 4 minutes     Prior RADIOLOGY STUDIES  The following studies were reviewed today:    Past Medical History:  Diagnosis Date  . Coronary artery calcification seen on CT scan    a. 06/2015 CTA Chest: coronary and Ao Ca2+ noted.  . Diabetes mellitus without complication (HCC)   . Diastolic dysfunction    a. 12/2016 Echo: Gr1 DD.  Marland Kitchen Dyspnea on exertion    a. 05/2015 Stress Echo: normal wall motion, nl LV fxn;  b. 12/2016 Echo: EF 60-65%, small cavity szie w/ near cavity obliteration  in systole.  LVOT . No change w/ valsalva. Mild conc LVH, Gr1 DD, mild MR, nl RV fxn, PASP .  . Fall at home    DUE TO DEHYDRATION-CARDIAC W/U NEGATIVE IN MARCH 2017 WITH DR [facility]  . Hyperlipidemia   . PAH (pulmonary artery hypertension) (HCC)    a. 12/2016 Echo: PASP .  . Sinus tachycardia    a. 06/2015 Holter: Sinus tach. No significant arrhythmias.  . Tobacco abuse   . Venous insufficiency    a. 12/2016 ABI wnl; b. 12/2016 Venous doppler: venous  incompetence in R saphenous femoral jxn and the great saphenous vein. Venous incompetence on L. No DVT.   Past Surgical History:  Procedure Laterality Date  . CARPAL TUNNEL RELEASE Right 01/18/2016   Procedure: CARPAL TUNNEL RELEASE AND GUYUNS CANAL RIGHT HAND;  Surgeon: Kennedy Bucker, MD;  Location: [facility] ORS;  Service: Orthopedics;  Laterality: Right;  . JOINT REPLACEMENT Left 2003   PARTIAL KNEE  . MUSCLE BIOPSY       No outpatient medications have been marked as taking for the 08/27/18 encounter (Appointment) with Erin Fulling, MD.     Allergies:   Erythromycin   Social History   Tobacco Use  . Smoking status: Current Some Day Smoker    Packs/day: 0.25    Years: 45.00    Pack years: 11.25    Types: Cigarettes  . Smokeless tobacco: Never Used  . Tobacco comment: 1-2 CIGARETTES EVERY OTHER DAY  Substance Use Topics  . Alcohol use: No    Comment: RARE  . Drug use: No     Family Hx: The patient's family history includes Heart disease in her father.   Review of Systems:  Gen:  Denies  fever, sweats, chills weigh loss  HEENT: Denies blurred vision, double vision, ear pain, eye pain, hearing loss, nose bleeds, sore throat Cardiac:  No dizziness, chest pain or heaviness, chest tightness,edema, No JVD Resp:  +shortness of breath,-wheezing, -hemoptysis,  Gi: Denies swallowing difficulty, stomach pain, nausea or vomiting, diarrhea, constipation, bowel incontinence Gu:  Denies bladder incontinence, burning urine Ext:   +swelling Skin: Denies  skin rash, easy bruising or bleeding or hives Endoc:  Denies polyuria, polydipsia , polyphagia or weight change Psych:   Denies depression, insomnia or hallucinations  Other:  All other systems negative   Vital Signs:  There were no vitals taken for this visit.       Labs/Other Tests and Data Reviewed:    Recent Labs: 12/05/2017: BUN 12; Creatinine, Ser 0.81; Potassium 4.7; Sodium 141     ASSESSMENT & PLAN:    SOB AND DOE with   Extensive smoking history  High probability of COPD Check PFT Check Check ONO  Lung cancer Screening referral needed  Follow up after test completed      COVID-19 Education: The signs and symptoms of COVID-19 were discussed with the patient and how to seek care for testing (follow up with PCP or arrange E-visit).  The importance of social distancing was discussed today.  Patient Risk:   After full review of this patients clinical status, I feel that they are at least moderate risk at this time.  Time:   Today, I have spent 45 minutes with the patient with telehealth technology discussing .     Medication Adjustments/Labs and Tests Ordered: Current medicines are reviewed at length with the patient today.  Concerns regarding medicines are outlined above.   Tests Ordered: PFT ONO Lung cancer screening referral  Disposition: Follow-up in 3 months   Patient  satisfied with Plan of action and management. All questions answered     Lucie LeatherKurian David Anahi Belmar, M.D.  [facility]  Medical Director [facility] Medical Director [facility]

## 2018-08-31 ENCOUNTER — Telehealth: Payer: Self-pay | Admitting: *Deleted

## 2018-08-31 NOTE — Telephone Encounter (Signed)
Received referral for lung screening scan, contacted patient and informed that we will schedule lung screening as soon as restrictions allow.

## 2018-09-02 DIAGNOSIS — J449 Chronic obstructive pulmonary disease, unspecified: Secondary | ICD-10-CM | POA: Diagnosis not present

## 2018-09-02 DIAGNOSIS — R0902 Hypoxemia: Secondary | ICD-10-CM | POA: Diagnosis not present

## 2018-09-14 ENCOUNTER — Telehealth: Payer: Self-pay

## 2018-09-14 NOTE — Telephone Encounter (Signed)
Called patient with results of ONO. Patient does need oxygen at night. She is aware.   Set patient up with 6 min walk test.

## 2018-09-24 ENCOUNTER — Telehealth: Payer: Self-pay | Admitting: *Deleted

## 2018-09-24 DIAGNOSIS — Z122 Encounter for screening for malignant neoplasm of respiratory organs: Secondary | ICD-10-CM

## 2018-09-24 DIAGNOSIS — Z87891 Personal history of nicotine dependence: Secondary | ICD-10-CM

## 2018-09-24 NOTE — Telephone Encounter (Signed)
Received referral for initial lung cancer screening scan. Contacted patient and obtained smoking history,(current, 39 pack year) as well as answering questions related to screening process. Patient denies signs of lung cancer such as weight loss or hemoptysis. Patient denies comorbidity that would prevent curative treatment if lung cancer were found. Patient is scheduled for shared decision making visit and CT scan on 09/29/18 at 145pm.

## 2018-09-29 ENCOUNTER — Other Ambulatory Visit: Payer: Self-pay

## 2018-09-29 ENCOUNTER — Encounter: Payer: Self-pay | Admitting: Oncology

## 2018-09-29 ENCOUNTER — Ambulatory Visit
Admission: RE | Admit: 2018-09-29 | Discharge: 2018-09-29 | Disposition: A | Discharge status: Home / Self Care | Payer: PPO | Source: Ambulatory Visit | Attending: Nurse Practitioner | Admitting: Nurse Practitioner

## 2018-09-29 ENCOUNTER — Inpatient Hospital Stay: Payer: PPO | Attending: Nurse Practitioner | Admitting: Oncology

## 2018-09-29 DIAGNOSIS — F17210 Nicotine dependence, cigarettes, uncomplicated: Secondary | ICD-10-CM | POA: Diagnosis not present

## 2018-09-29 DIAGNOSIS — Z122 Encounter for screening for malignant neoplasm of respiratory organs: Secondary | ICD-10-CM | POA: Diagnosis not present

## 2018-09-29 DIAGNOSIS — Z87891 Personal history of nicotine dependence: Secondary | ICD-10-CM | POA: Diagnosis not present

## 2018-09-29 NOTE — Progress Notes (Signed)
Virtual Visit via Video Note  I connected with@ on 09/29/18 at  1:45 PM EDT by a video enabled telemedicine application and verified that I am speaking with the correct person using two identifiers.  Location: Patient: [facility] Provider: Home   I discussed the limitations of evaluation and management by telemedicine and the availability of in person appointments. The patient expressed understanding and agreed to proceed.  I discussed the assessment and treatment plan with the patient. The patient was provided an opportunity to ask questions and all were answered. The patient agreed with the plan and demonstrated an understanding of the instructions.   The patient was advised to call back or seek an in-person evaluation if the symptoms worsen or if the condition fails to improve as anticipated.   In accordance with CMS guidelines, patient has met eligibility criteria including age, absence of signs or symptoms of lung cancer.  Social History   Tobacco Use  . Smoking status: Current Some Day Smoker    Packs/day: 0.75    Years: 52.00    Pack years: 39.00    Types: Cigarettes  . Smokeless tobacco: Never Used  . Tobacco comment: 1-2 CIGARETTES EVERY OTHER DAY  Substance Use Topics  . Alcohol use: No    Comment: RARE  . Drug use: No      A shared decision-making session was conducted prior to the performance of CT scan. This includes one or more decision aids, includes benefits and harms of screening, follow-up diagnostic testing, over-diagnosis, false positive rate, and total radiation exposure.   Counseling on the importance of adherence to annual lung cancer LDCT screening, impact of co-morbidities, and ability or willingness to undergo diagnosis and treatment is imperative for compliance of the program.   Counseling on the importance of continued smoking cessation for former smokers; the importance of smoking cessation for current smokers, and information about tobacco cessation  interventions have been given to patient including [place] and [employer] quit [place] programs.   Written order for lung cancer screening with LDCT has been given to the patient and any and all questions have been answered to the best of my abilities.    Yearly follow up will be coordinated by Burgess Estelle, Thoracic Navigator.  I provided 10 minutes of face-to-face video visit time during this encounter, and > 50% was spent counseling as documented under my assessment & plan.   Jacquelin Hawking, NP

## 2018-10-02 ENCOUNTER — Encounter: Payer: Self-pay | Admitting: *Deleted

## 2018-10-12 DIAGNOSIS — E785 Hyperlipidemia, unspecified: Secondary | ICD-10-CM | POA: Diagnosis not present

## 2018-10-12 DIAGNOSIS — E119 Type 2 diabetes mellitus without complications: Secondary | ICD-10-CM | POA: Diagnosis not present

## 2018-10-20 DIAGNOSIS — E119 Type 2 diabetes mellitus without complications: Secondary | ICD-10-CM | POA: Diagnosis not present

## 2018-10-20 DIAGNOSIS — E785 Hyperlipidemia, unspecified: Secondary | ICD-10-CM | POA: Diagnosis not present

## 2018-10-20 DIAGNOSIS — N390 Urinary tract infection, site not specified: Secondary | ICD-10-CM | POA: Diagnosis not present

## 2018-10-23 ENCOUNTER — Telehealth: Payer: Self-pay | Admitting: Internal Medicine

## 2018-10-23 NOTE — Telephone Encounter (Signed)
Called patient for COVID-19 pre-screening for in office visit.  Have you recently traveled any where out of the local area in the last 2 weeks?No  Have you been in close contact with a person diagnosed with COVID-19 or someone awaiting results within the last 2 weeks? NO  Do you currently have any of the following symptoms? If so, when did they start? Cough     Diarrhea   Joint Pain Fever      Muscle Pain   Red eyes Shortness of breath   Abdominal pain  Vomiting Loss of smell    Rash    Sore Throat Headache    Weakness   Bruising or bleeding   Okay to proceed with visit 10/26/2018

## 2018-10-26 ENCOUNTER — Other Ambulatory Visit: Payer: Self-pay

## 2018-10-26 ENCOUNTER — Ambulatory Visit (INDEPENDENT_AMBULATORY_CARE_PROVIDER_SITE_OTHER): Payer: PPO

## 2018-10-26 DIAGNOSIS — R0609 Other forms of dyspnea: Secondary | ICD-10-CM | POA: Diagnosis not present

## 2018-10-26 NOTE — Progress Notes (Signed)
SIX MIN WALK 10/26/2018  Medications Aspirin 81mg , Lipitor 40mg , Cymbalta 30mg , metformin 500mg  and Metoprolol 50mg  all taken at 11:30a  Supplimental Oxygen during Test? (L/min) No  Laps 13  Partial Lap (in Meters) 0  Baseline BP (sitting) 124/78  Baseline Heartrate 97  Baseline Dyspnea (Borg Scale) 1  Baseline Fatigue (Borg Scale) 2  Baseline SPO2 96  BP (sitting) 136/84  Heartrate 115  Dyspnea (Borg Scale) 2  Fatigue (Borg Scale) 2  SPO2 97  BP (sitting) 130/80  Heartrate 101  SPO2 98  Stopped or Paused before Six Minutes No  Distance Completed 442  Tech Comments: pt completed test with c/o sob. steady gait.

## 2018-10-27 ENCOUNTER — Other Ambulatory Visit: Payer: Self-pay | Admitting: Physician Assistant

## 2018-10-28 ENCOUNTER — Other Ambulatory Visit: Payer: Self-pay

## 2018-11-10 ENCOUNTER — Telehealth: Payer: Self-pay | Admitting: Internal Medicine

## 2018-11-10 NOTE — Telephone Encounter (Signed)
Pt aware of covid test date/time.  Nothing further is needed.  

## 2018-11-13 ENCOUNTER — Other Ambulatory Visit
Admission: RE | Admit: 2018-11-13 | Discharge: 2018-11-13 | Disposition: A | Discharge status: Home / Self Care | Payer: PPO | Source: Ambulatory Visit | Attending: Internal Medicine | Admitting: Internal Medicine

## 2018-11-13 ENCOUNTER — Other Ambulatory Visit: Payer: Self-pay

## 2018-11-16 ENCOUNTER — Telehealth: Payer: Self-pay

## 2018-11-16 ENCOUNTER — Other Ambulatory Visit: Payer: Self-pay

## 2018-11-16 NOTE — Telephone Encounter (Signed)
PFT R/S to Tuesday 01/12/2019 at 4:15. Pt to arrive at 4:00 to [facility]. Pt will need COVID test prior. Rhonda J Cobb

## 2018-11-16 NOTE — Telephone Encounter (Signed)
Per Caryl Pina with pre admit testing- covid test performed on 11/13/2018 has been misplaced by lab.  Pt will need to be re tested today prior to 11:00p if possible.  Left detailed message to make pt aware of this information.

## 2018-11-16 NOTE — Telephone Encounter (Addendum)
Left message x3 for pt.  [facility]. If pt is unable to have repeat covid test, PFT for 11/17/2018 will need to be rescheduled.

## 2018-11-16 NOTE — Telephone Encounter (Signed)
Pt n/s covid testing on 11/13/2018. PFT for 11/17/2018 will need to be rescheduled.  Left message to make pt aware of this information.   Rhonda, please advise. Thanks

## 2018-11-16 NOTE — Telephone Encounter (Signed)
Pt had COVID Test but was misplaced. Caryl Pina with Pre-Admit Testing stated that test was done on 11/13/2018. Pt will be retested today and therefore PFT will stay on schedule for 11/17/2018 at 4:15.  Rhonda J Cobb

## 2018-11-16 NOTE — Telephone Encounter (Signed)
Left message x2 for pt. 

## 2018-11-17 ENCOUNTER — Ambulatory Visit: Payer: PPO

## 2018-11-17 ENCOUNTER — Other Ambulatory Visit: Admission: RE | Admit: 2018-11-17 | Payer: PPO | Source: Ambulatory Visit

## 2018-11-17 NOTE — Telephone Encounter (Signed)
lmtcb x4 for pt on mobile number.  I have spoken to pt's granddaughter, Burman Nieves (dpr/ec).  Maggie stated [place] and requested that PFT be rescheduled.  Burman Nieves will have pt return our call later today, as she is currently sleeping.  Will confirm with pt that PFT in fact needs to be canceled, prior to canceling.

## 2018-11-18 NOTE — Telephone Encounter (Signed)
First available PFT is Tues 01/19/2019 at 4:00.  Pt will need COVID test and to R/S F/U appointment with Dr. Mortimer Fries. Rhonda J Cobb

## 2018-11-18 NOTE — Telephone Encounter (Signed)
ATC pt to advise of PFT.  No answer. LMOVM of appointment date, time and location. Rhonda J Cobb

## 2018-11-18 NOTE — Telephone Encounter (Signed)
Since I was unable to speak with patient, I have mailed the appointment date, time and location as well as PFT instructions to patient.   Follow up appointment will need to be R/S as well. Rhonda J Cobb

## 2018-11-18 NOTE — Telephone Encounter (Signed)
It does not appear that pt showed for PFT.  Suanne Marker, can you assist with rescheduling pt.

## 2018-11-19 ENCOUNTER — Telehealth: Payer: Self-pay | Admitting: Internal Medicine

## 2018-11-19 NOTE — Telephone Encounter (Signed)
Spoke to patient regarding PFT, let her know we would call her about a week before to give her covid testing date and time. Also, r/s her f/u apt. Nothing further at this time.

## 2018-11-24 ENCOUNTER — Ambulatory Visit: Payer: PPO | Admitting: Internal Medicine

## 2018-12-22 DIAGNOSIS — N390 Urinary tract infection, site not specified: Secondary | ICD-10-CM | POA: Diagnosis not present

## 2018-12-24 ENCOUNTER — Other Ambulatory Visit: Payer: PPO

## 2019-01-12 ENCOUNTER — Ambulatory Visit: Payer: PPO

## 2019-01-15 ENCOUNTER — Telehealth: Payer: Self-pay | Admitting: Internal Medicine

## 2019-01-15 NOTE — Telephone Encounter (Signed)
Left message to relay date/time for covid test prior to PFT.  [facility]/18/2019 [facility]  

## 2019-01-15 NOTE — Telephone Encounter (Signed)
Left message x2 for pt. 

## 2019-01-18 ENCOUNTER — Other Ambulatory Visit: Payer: Self-pay

## 2019-01-18 ENCOUNTER — Other Ambulatory Visit
Admission: RE | Admit: 2019-01-18 | Discharge: 2019-01-18 | Disposition: A | Discharge status: Home / Self Care | Payer: PPO | Source: Ambulatory Visit | Attending: Internal Medicine | Admitting: Internal Medicine

## 2019-01-18 DIAGNOSIS — Z20828 Contact with and (suspected) exposure to other viral communicable diseases: Secondary | ICD-10-CM | POA: Diagnosis not present

## 2019-01-18 DIAGNOSIS — Z01812 Encounter for preprocedural laboratory examination: Secondary | ICD-10-CM | POA: Diagnosis not present

## 2019-01-18 NOTE — Telephone Encounter (Signed)
Pt is aware of date/time for covid test.  Nothing further is needed.

## 2019-01-18 NOTE — Telephone Encounter (Signed)
Left message x3 for pt.  Also contacted pt's EC and left message requesting that pt contact our office.

## 2019-01-19 ENCOUNTER — Telehealth: Payer: Self-pay | Admitting: Internal Medicine

## 2019-01-19 ENCOUNTER — Ambulatory Visit: Payer: PPO

## 2019-01-19 ENCOUNTER — Other Ambulatory Visit: Payer: PPO

## 2019-01-19 LAB — SARS CORONAVIRUS 2 (TAT 6-24 HRS): SARS Coronavirus 2: NEGATIVE

## 2019-01-19 NOTE — Telephone Encounter (Signed)
Suanne Marker can you get this rescheduled for the Patient. She may need to reschedule her appt with Dr. Mortimer Fries also

## 2019-01-19 NOTE — Telephone Encounter (Signed)
Left message for pt

## 2019-01-19 NOTE — Telephone Encounter (Signed)
LMOVM for Dana [name] in Cardiopulmonary to cancel PFT for 01/20/2019 due to pt being sick. Rhonda J Cobb

## 2019-01-19 NOTE — Telephone Encounter (Signed)
Patient is calling back to make sure appt was cancelled.  Please advise.  [phone].

## 2019-01-19 NOTE — Telephone Encounter (Signed)
PFT canceled for today 01/19/2019 at 4:15. Pt has been R/S to first available PFT for Tues 04/13/2019 at 4:15. Pt to arrive at 4:00 to [facility]   Pt has appointment with Dr. Mortimer Fries on 01/21/2019 to review PFT and SMW. Rhonda J Cobb

## 2019-01-20 NOTE — Telephone Encounter (Signed)
Left message x 2 for pt. If pt calls back please send her directly to me. Thank you

## 2019-01-21 ENCOUNTER — Ambulatory Visit: Payer: PPO | Admitting: Internal Medicine

## 2019-01-21 NOTE — Telephone Encounter (Signed)
Left message for pt

## 2019-01-25 ENCOUNTER — Ambulatory Visit (INDEPENDENT_AMBULATORY_CARE_PROVIDER_SITE_OTHER): Payer: PPO

## 2019-01-25 ENCOUNTER — Other Ambulatory Visit: Payer: Self-pay | Admitting: Physician Assistant

## 2019-01-25 ENCOUNTER — Other Ambulatory Visit: Payer: Self-pay

## 2019-01-25 DIAGNOSIS — I2721 Secondary pulmonary arterial hypertension: Secondary | ICD-10-CM | POA: Diagnosis not present

## 2019-01-25 DIAGNOSIS — I5032 Chronic diastolic (congestive) heart failure: Secondary | ICD-10-CM

## 2019-01-25 DIAGNOSIS — R0609 Other forms of dyspnea: Secondary | ICD-10-CM | POA: Diagnosis not present

## 2019-01-25 NOTE — Telephone Encounter (Signed)
Pt due for 6 month f/u.  Please contact pt for future appointment. 

## 2019-01-25 NOTE — Telephone Encounter (Signed)
Patient is scheduled for 11/10 with Arida. Per recall pt is due at the end of October

## 2019-01-25 NOTE — Telephone Encounter (Signed)
Letter has been mailed to address on file.  

## 2019-01-29 ENCOUNTER — Telehealth: Payer: Self-pay

## 2019-01-29 NOTE — Telephone Encounter (Signed)
-----   Message from Theora Gianotti, NP sent at 01/28/2019  5:49 PM EDT ----- Nl heart squeezing fxn. Mildly stiff heart muscle - important to keep HR/BP under good control. No significant valvular dzs.  Unable to estimate pulmonary artery pressure, though right atrial pressure was normal @ 79mmHg, which is reassuring.

## 2019-01-29 NOTE — Telephone Encounter (Signed)
LMTCB regarding echo results  

## 2019-02-01 ENCOUNTER — Telehealth: Payer: Self-pay

## 2019-02-01 NOTE — Telephone Encounter (Signed)
Notes recorded by Frederik Schmidt, RN on 02/01/2019 at 12:20 PM EDT  lpmtcb 10/5  ------

## 2019-02-01 NOTE — Telephone Encounter (Signed)
-----   Message from Christopher Ronald Berge, NP sent at 01/28/2019  5:49 PM EDT ----- Nl heart squeezing fxn. Mildly stiff heart muscle - important to keep HR/BP under good control. No significant valvular dzs.  Unable to estimate pulmonary artery pressure, though right atrial pressure was normal @ 3mmHg, which is reassuring. 

## 2019-02-10 NOTE — Telephone Encounter (Signed)
I spoke with the patient regarding her echo results.  

## 2019-02-15 DIAGNOSIS — N390 Urinary tract infection, site not specified: Secondary | ICD-10-CM | POA: Diagnosis not present

## 2019-02-15 DIAGNOSIS — Z23 Encounter for immunization: Secondary | ICD-10-CM | POA: Diagnosis not present

## 2019-02-15 DIAGNOSIS — E785 Hyperlipidemia, unspecified: Secondary | ICD-10-CM | POA: Diagnosis not present

## 2019-02-15 DIAGNOSIS — E119 Type 2 diabetes mellitus without complications: Secondary | ICD-10-CM | POA: Diagnosis not present

## 2019-02-23 DIAGNOSIS — E119 Type 2 diabetes mellitus without complications: Secondary | ICD-10-CM | POA: Diagnosis not present

## 2019-02-23 DIAGNOSIS — F330 Major depressive disorder, recurrent, mild: Secondary | ICD-10-CM | POA: Diagnosis not present

## 2019-02-23 DIAGNOSIS — E785 Hyperlipidemia, unspecified: Secondary | ICD-10-CM | POA: Diagnosis not present

## 2019-03-09 ENCOUNTER — Ambulatory Visit (INDEPENDENT_AMBULATORY_CARE_PROVIDER_SITE_OTHER): Payer: PPO | Admitting: Cardiovascular Disease

## 2019-03-09 ENCOUNTER — Encounter: Payer: Self-pay | Admitting: Cardiovascular Disease

## 2019-03-09 ENCOUNTER — Other Ambulatory Visit: Payer: Self-pay

## 2019-03-09 VITALS — BP 128/90 | HR 82 | Temp 97.8°F | Ht 66.0 in | Wt 188.0 lb

## 2019-03-09 DIAGNOSIS — E785 Hyperlipidemia, unspecified: Secondary | ICD-10-CM | POA: Diagnosis not present

## 2019-03-09 DIAGNOSIS — R000 Tachycardia, unspecified: Secondary | ICD-10-CM

## 2019-03-09 DIAGNOSIS — I2721 Secondary pulmonary arterial hypertension: Secondary | ICD-10-CM | POA: Diagnosis not present

## 2019-03-09 DIAGNOSIS — I5032 Chronic diastolic (congestive) heart failure: Secondary | ICD-10-CM

## 2019-03-09 NOTE — Progress Notes (Signed)
Cardiology Office Note   Date:  03/09/2019   ID:  Dana [name], DOB 03/27/1946, MRN [id number]  PCP:  Jaclyn Shaggy, MD  Cardiologist:   Lorine Bears, MD   Chief Complaint  Patient presents with  . other    6 month follow up. Meds reviewed by the pt. verbally. Pt. c/o chest heaviness and shortness of breath with showering and washing hair.       History of Present Illness: Dana [name] is a 73 y.o. female who is here today for follow-up visit regarding chronic calcifications noted on CT of the chest, chronic and diastolic heart failure with pulmonary hypertension. She has multiple chronic medical conditions including tobacco use, essential hypertension, hyperlipidemia, sinus tachycardia, venous insufficiency, diabetes mellitus and carpal tunnel disease. She has chronic discoloration of her toes with normal ABI in the past.  There was some venous insufficiency on venous Doppler. She was seen in September 2018 for worsening exertional dyspnea.  An echocardiogram showed hyperdynamic LV systolic function with moderate to severe pulmonary hypertension.  Sinus tachycardia improved with metoprolol. Lexiscan Myoview in June 2019 was normal.  Cardiac MRI showed no evidence of infiltrative heart disease.  EF was 58%. She reports stable symptoms overall with no recent chest pain.  She continues to smoke few cigarettes a day.  Repeat echocardiogram in September of this year showed normal LV systolic function, grade 1 diastolic dysfunction.  Pulmonary pressures could not be estimated.  Past Medical History:  Diagnosis Date  . Coronary artery calcification seen on CT scan    a. 06/2015 CTA Chest: coronary and Ao Ca2+ noted.  . Diabetes mellitus without complication (HCC)   . Diastolic dysfunction    a. 12/2016 Echo: Gr1 DD.  Marland Kitchen Dyspnea on exertion    a. 05/2015 Stress Echo: normal wall motion, nl LV fxn;  b. 12/2016 Echo: EF 60-65%, small cavity szie w/ near cavity  obliteration in systole.  LVOT . No change w/ valsalva. Mild conc LVH, Gr1 DD, mild MR, nl RV fxn, PASP .  . Fall at home    DUE TO DEHYDRATION-CARDIAC W/U NEGATIVE IN MARCH 2017 WITH DR [facility]  . Hyperlipidemia   . PAH (pulmonary artery hypertension) (HCC)    a. 12/2016 Echo: PASP .  . Sinus tachycardia    a. 06/2015 Holter: Sinus tach. No significant arrhythmias.  . Tobacco abuse   . Venous insufficiency    a. 12/2016 ABI wnl; b. 12/2016 Venous doppler: venous incompetence in R saphenous femoral jxn and the great saphenous vein. Venous incompetence on L. No DVT.    Past Surgical History:  Procedure Laterality Date  . CARPAL TUNNEL RELEASE Right 01/18/2016   Procedure: CARPAL TUNNEL RELEASE AND GUYUNS CANAL RIGHT HAND;  Surgeon: Kennedy Bucker, MD;  Location: [facility] ORS;  Service: Orthopedics;  Laterality: Right;  . JOINT REPLACEMENT Left 2003   PARTIAL KNEE  . MUSCLE BIOPSY       Current Outpatient Medications  Medication Sig Dispense Refill  . aspirin EC 81 MG tablet Take 1 tablet (81 mg total) by mouth daily. 90 tablet 3  . atorvastatin (LIPITOR) 40 MG tablet Take one tab in the pm    . CRANBERRY PO Take 1 tablet by mouth as needed.     . docusate sodium (STOOL SOFTENER) 100 MG capsule Take 100 mg by mouth as needed for mild constipation.    . DULoxetine (CYMBALTA) 30 MG capsule Take 30 mg by mouth daily.     Marland Kitchen  Homeopathic Products (ALLERGY MEDICINE PO) Take 1 tablet by mouth daily.    Marland Kitchen LORazepam (ATIVAN) 1 MG tablet TK 1 T PO QHS    . metFORMIN (GLUCOPHAGE) 500 MG tablet Take 1,000 mg by mouth 2 (two) times daily with a meal.     . metoprolol tartrate (LOPRESSOR) 50 MG tablet TAKE 1 AND 1/2 TABLETS(75 MG) BY MOUTH TWICE DAILY 270 tablet 0   No current facility-administered medications for this visit.     Allergies:   Azithromycin and Erythromycin    Social History:  The patient  reports that she has been smoking cigarettes. She has a 39.00 pack-year smoking  history. She has never used smokeless tobacco. She reports that she does not drink alcohol or use drugs.   Family History:  The patient's family history includes Heart disease in her father.    ROS:  Please see the history of present illness.   Otherwise, review of systems are positive for none.   All other systems are reviewed and negative.    PHYSICAL EXAM: VS:  BP 128/90 (BP Location: Left Arm, Patient Position: Sitting, Cuff Size: Normal)   Pulse 82   Temp 97.8 F (36.6 C)   Ht 5\' 6"  (1.676 m)   Wt 188 lb (85.3 kg)   BMI 30.34 kg/m  , BMI Body mass index is 30.34 kg/m. GEN: Well nourished, well developed, in no acute distress  HEENT: normal  Neck: no JVD, carotid bruits, or masses Cardiac: RRR But tachycardic; no  rubs, or gallops, trace edema . 2/6 systolic ejection murmur in the aortic area Respiratory:  clear to auscultation bilaterally, normal work of breathing GI: soft, nontender, nondistended, + BS MS: no deformity or atrophy  Skin: warm and dry, no rash Neuro:  Strength and sensation are intact Psych: euthymic mood, full affect   EKG:  EKG is ordered today. The ekg ordered today demonstrates normal sinus rhythm with no significant ST or T wave changes.   Recent Labs: No results found for requested labs within last 8760 hours.    Lipid Panel No results found for: CHOL, TRIG, HDL, CHOLHDL, VLDL, LDLCALC, LDLDIRECT    Wt Readings from Last 3 Encounters:  03/09/19 188 lb (85.3 kg)  09/29/18 188 lb (85.3 kg)  08/24/18 188 lb (85.3 kg)        PAD Screen 01/02/2017  Previous PAD dx? No  Previous surgical procedure? No  Pain with walking? Yes  Subsides with rest? No  Feet/toe relief with dangling? No  Painful, non-healing ulcers? No  Extremities discolored? Yes      ASSESSMENT AND PLAN:  1.  Pulmonary hypertension: Recent echocardiogram could not estimate pulmonary pressure.  Patient is awaiting pulmonary evaluation to get PFTs and likely sleep study  done.  2.  Sinus tachycardia: Controlled with metoprolol.    3.  Tobacco use: I discussed the importance of smoking cessation.    4.  Chronic venous insufficiency of the lower extremities: Seems to be stable.    5.  Hyperlipidemia: Currently on atorvastatin 40 mg daily.    Disposition:   FU  in 12 months  Signed,  Kathlyn Sacramento, MD  03/09/2019 2:55 PM    [place]

## 2019-03-09 NOTE — Patient Instructions (Signed)
Medication Instructions:  Your physician recommends that you continue on your current medications as directed. Please refer to the Current Medication list given to you today.  *If you need a refill on your cardiac medications before your next appointment, please call your pharmacy*  Lab Work: None ordered If you have labs (blood work) drawn today and your tests are completely normal, you will receive your results only by: . MyChart Message (if you have MyChart) OR . A paper copy in the mail If you have any lab test that is abnormal or we need to change your treatment, we will call you to review the results.  Testing/Procedures: None ordered  Follow-Up: At [facility], you and your health needs are our priority.  As part of our continuing mission to provide you with exceptional heart care, we have created designated Provider Care Teams.  These Care Teams include your primary Cardiologist (physician) and Advanced Practice Providers (APPs -  Physician Assistants and Nurse Practitioners) who all work together to provide you with the care you need, when you need it.  Your next appointment:   12 month(s)  The format for your next appointment:   In Person  Provider:    You may see Muhammad Arida, MD or one of the following Advanced Practice Providers on your designated Care Team:    Christopher Berge, NP  Ryan Dunn, PA-C  Jacquelyn Visser, PA-C   Other Instructions N/A  

## 2019-03-23 DIAGNOSIS — E785 Hyperlipidemia, unspecified: Secondary | ICD-10-CM | POA: Diagnosis not present

## 2019-03-23 DIAGNOSIS — F330 Major depressive disorder, recurrent, mild: Secondary | ICD-10-CM | POA: Diagnosis not present

## 2019-03-23 DIAGNOSIS — E119 Type 2 diabetes mellitus without complications: Secondary | ICD-10-CM | POA: Diagnosis not present

## 2019-04-08 ENCOUNTER — Telehealth: Payer: Self-pay | Admitting: Internal Medicine

## 2019-04-08 NOTE — Telephone Encounter (Signed)
Lm to relay date/time of covid test.  04/12/2019 prior to [facility]

## 2019-04-09 NOTE — Telephone Encounter (Signed)
Lm x2 for pt.  

## 2019-04-12 NOTE — Telephone Encounter (Signed)
Lm x3 for pt. Also left message for pt's EC, Maggie.

## 2019-04-13 ENCOUNTER — Ambulatory Visit: Payer: PPO

## 2019-04-14 DIAGNOSIS — F418 Other specified anxiety disorders: Secondary | ICD-10-CM | POA: Diagnosis not present

## 2019-04-25 ENCOUNTER — Other Ambulatory Visit: Payer: Self-pay | Admitting: Physician Assistant

## 2019-06-04 DIAGNOSIS — E785 Hyperlipidemia, unspecified: Secondary | ICD-10-CM | POA: Diagnosis not present

## 2019-06-04 DIAGNOSIS — E119 Type 2 diabetes mellitus without complications: Secondary | ICD-10-CM | POA: Diagnosis not present

## 2019-06-09 DIAGNOSIS — N390 Urinary tract infection, site not specified: Secondary | ICD-10-CM | POA: Diagnosis not present

## 2019-06-09 DIAGNOSIS — E119 Type 2 diabetes mellitus without complications: Secondary | ICD-10-CM | POA: Diagnosis not present

## 2019-06-09 DIAGNOSIS — E785 Hyperlipidemia, unspecified: Secondary | ICD-10-CM | POA: Diagnosis not present

## 2019-06-09 DIAGNOSIS — F330 Major depressive disorder, recurrent, mild: Secondary | ICD-10-CM | POA: Diagnosis not present

## 2019-07-24 ENCOUNTER — Other Ambulatory Visit: Payer: Self-pay | Admitting: Cardiovascular Disease

## 2019-09-14 ENCOUNTER — Telehealth: Payer: Self-pay

## 2019-09-14 NOTE — Telephone Encounter (Signed)
Patient has been notified that the low dose lung cancer screening CT scan is due currently or will be in near future.  She does not want to have CT scan at this time and request a return call after summer.

## 2019-09-20 IMAGING — NM NM MYOCAR MULTI W/SPECT W/WALL MOTION & EF
11 series · 60 of 60 positions shown · non-contrast
Comparison: none

[Series 1000: raw stress-gated_dc · 4.80mm/px · 5 of 544 frames shown]
[frame 46/544  full-range]
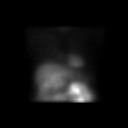
[frame 136/544  full-range]
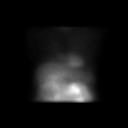
[frame 227/544  full-range]
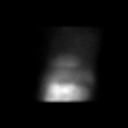
[frame 318/544  full-range]
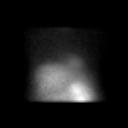
[frame 408/544  full-range]
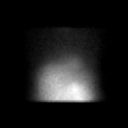

[Series 1000: raw rest_dc · 4.80mm/px · 6 of 68 frames shown]
[frame 6/68]
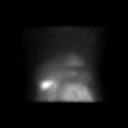
[frame 17/68]
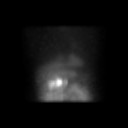
[frame 29/68]
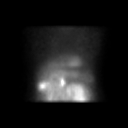
[frame 40/68]
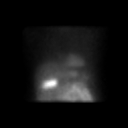
[frame 51/68]
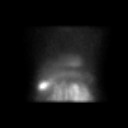
[frame 63/68]
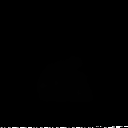

[Series 1000: raw rest (recon - ac ) · 4.8mm · 4.80mm/px · 5 of 29 frames shown]
[frame 3/29]
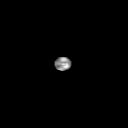
[frame 7/29]
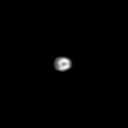
[frame 12/29]
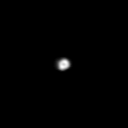
[frame 17/29]
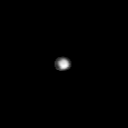
[frame 27/29]
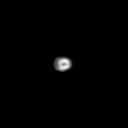

[Series 1000: raw rest (recon - noac ) · 4.8mm · 4.80mm/px · 6 of 30 frames shown]
[frame 3/30]
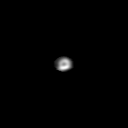
[frame 8/30]
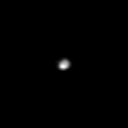
[frame 13/30]
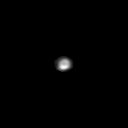
[frame 18/30]
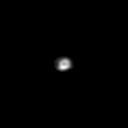
[frame 23/30]
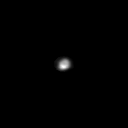
[frame 28/30]
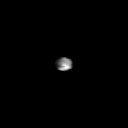

[Series 1000: raw stress_dc · 4.80mm/px · 5 of 68 frames shown]
[frame 6/68]
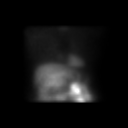
[frame 17/68]
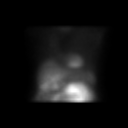
[frame 29/68]
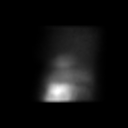
[frame 51/68]
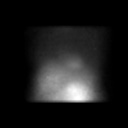
[frame 63/68]
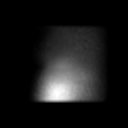

[Series 1000: raw stress · 4.80mm/px · 6 of 68 frames shown]
[frame 6/68]
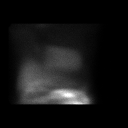
[frame 17/68]
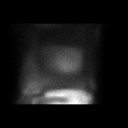
[frame 29/68]
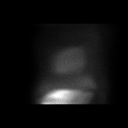
[frame 40/68]
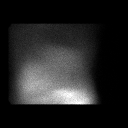
[frame 51/68]
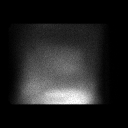
[frame 63/68]
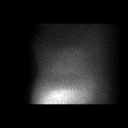

[Series 1000: raw stress-gated (recon) · 4.8mm · 4.80mm/px · 5 of 272 frames shown]
[frame 23/272]
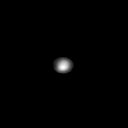
[frame 68/272]
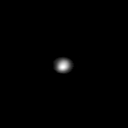
[frame 159/272]
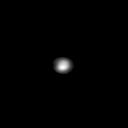
[frame 204/272]
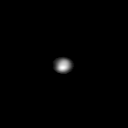
[frame 250/272]
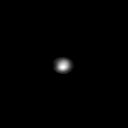

[Series 1000: raw rest · 4.80mm/px · 6 of 68 frames shown]
[frame 6/68]
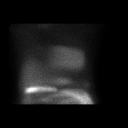
[frame 17/68]
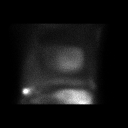
[frame 29/68]
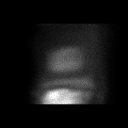
[frame 40/68]
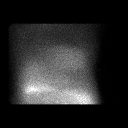
[frame 51/68]
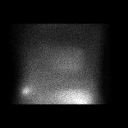
[frame 63/68]
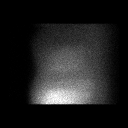

[Series 1000: raw stress (recon - noac ) · 4.8mm · 4.80mm/px · 5 of 32 frames shown]
[frame 3/32]
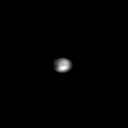
[frame 14/32]
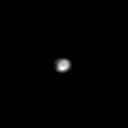
[frame 19/32]
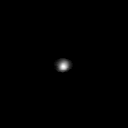
[frame 24/32]
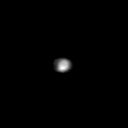
[frame 30/32]
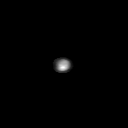

[Series 1000: raw stress (recon - ac ) · 4.8mm · 4.80mm/px · 6 of 29 frames shown]
[frame 3/29]
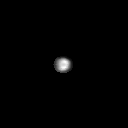
[frame 7/29]
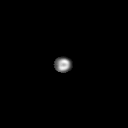
[frame 12/29]
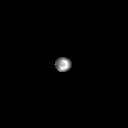
[frame 17/29]
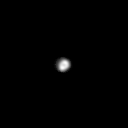
[frame 22/29]
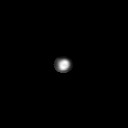
[frame 27/29]
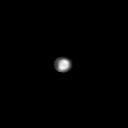

[Series 1000: raw stress-gated · 4.80mm/px · 5 of 532 frames shown]
[frame 133/532  full-range]
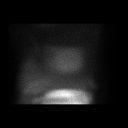
[frame 222/532  full-range]
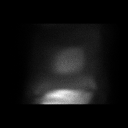
[frame 311/532  full-range]
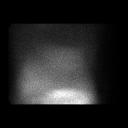
[frame 399/532  full-range]
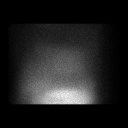
[frame 488/532  full-range]
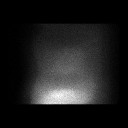

[60 of 60 positions shown; findings below may reference images not displayed]

Canned report from images found in remote index.

Refer to host system for actual result text.

## 2019-10-22 DIAGNOSIS — E119 Type 2 diabetes mellitus without complications: Secondary | ICD-10-CM | POA: Diagnosis not present

## 2019-10-22 DIAGNOSIS — E785 Hyperlipidemia, unspecified: Secondary | ICD-10-CM | POA: Diagnosis not present

## 2019-10-22 DIAGNOSIS — F330 Major depressive disorder, recurrent, mild: Secondary | ICD-10-CM | POA: Diagnosis not present

## 2019-10-26 DIAGNOSIS — E119 Type 2 diabetes mellitus without complications: Secondary | ICD-10-CM | POA: Diagnosis not present

## 2019-10-26 DIAGNOSIS — E785 Hyperlipidemia, unspecified: Secondary | ICD-10-CM | POA: Diagnosis not present

## 2020-01-08 ENCOUNTER — Telehealth: Payer: Self-pay

## 2020-01-08 NOTE — Telephone Encounter (Signed)
Patient notified that lung screening imaging is due currently or in the near future. Patient's preference is for 02/17/20 @ 3:30.  Patient is a current smoker, smoking 0.5-1 packs per day the past year.

## 2020-01-11 ENCOUNTER — Other Ambulatory Visit: Payer: Self-pay | Admitting: *Deleted

## 2020-01-11 DIAGNOSIS — Z87891 Personal history of nicotine dependence: Secondary | ICD-10-CM

## 2020-01-11 DIAGNOSIS — Z122 Encounter for screening for malignant neoplasm of respiratory organs: Secondary | ICD-10-CM

## 2020-01-11 NOTE — Progress Notes (Signed)
Current smoker, 39.75 pack year

## 2020-01-18 ENCOUNTER — Ambulatory Visit: Admission: RE | Admit: 2020-01-18 | Payer: PPO | Source: Ambulatory Visit

## 2020-02-04 DIAGNOSIS — E785 Hyperlipidemia, unspecified: Secondary | ICD-10-CM | POA: Diagnosis not present

## 2020-02-04 DIAGNOSIS — E119 Type 2 diabetes mellitus without complications: Secondary | ICD-10-CM | POA: Diagnosis not present

## 2020-02-10 ENCOUNTER — Ambulatory Visit: Admission: RE | Admit: 2020-02-10 | Payer: PPO | Source: Ambulatory Visit

## 2020-02-11 DIAGNOSIS — E119 Type 2 diabetes mellitus without complications: Secondary | ICD-10-CM | POA: Diagnosis not present

## 2020-02-11 DIAGNOSIS — E785 Hyperlipidemia, unspecified: Secondary | ICD-10-CM | POA: Diagnosis not present

## 2020-02-11 DIAGNOSIS — Z23 Encounter for immunization: Secondary | ICD-10-CM | POA: Diagnosis not present

## 2020-02-17 ENCOUNTER — Ambulatory Visit: Admission: RE | Admit: 2020-02-17 | Payer: PPO | Source: Ambulatory Visit

## 2020-02-29 ENCOUNTER — Telehealth: Payer: Self-pay | Admitting: *Deleted

## 2020-02-29 NOTE — Telephone Encounter (Signed)
Contacted patient in attempt to reschedule missed appt. Patient requests to consider lung screening in 45 days.

## 2020-03-14 ENCOUNTER — Ambulatory Visit (INDEPENDENT_AMBULATORY_CARE_PROVIDER_SITE_OTHER): Payer: PPO

## 2020-03-14 ENCOUNTER — Ambulatory Visit: Payer: PPO | Admitting: Family

## 2020-03-14 ENCOUNTER — Other Ambulatory Visit: Payer: Self-pay

## 2020-03-14 ENCOUNTER — Encounter: Payer: Self-pay | Admitting: Family

## 2020-03-14 VITALS — BP 136/80 | HR 91 | Ht 66.0 in | Wt 183.0 lb

## 2020-03-14 DIAGNOSIS — I951 Orthostatic hypotension: Secondary | ICD-10-CM

## 2020-03-14 DIAGNOSIS — R42 Dizziness and giddiness: Secondary | ICD-10-CM

## 2020-03-14 DIAGNOSIS — I872 Venous insufficiency (chronic) (peripheral): Secondary | ICD-10-CM

## 2020-03-14 DIAGNOSIS — I2721 Secondary pulmonary arterial hypertension: Secondary | ICD-10-CM

## 2020-03-14 DIAGNOSIS — Z720 Tobacco use: Secondary | ICD-10-CM | POA: Diagnosis not present

## 2020-03-14 DIAGNOSIS — I700 Atherosclerosis of aorta: Secondary | ICD-10-CM

## 2020-03-14 DIAGNOSIS — I2510 Atherosclerotic heart disease of native coronary artery without angina pectoris: Secondary | ICD-10-CM

## 2020-03-14 NOTE — Patient Instructions (Addendum)
Medication Instructions:  No medication changes today.   *If you need a refill on your cardiac medications before your next appointment, please call your pharmacy*   Lab Work: No lab work today.   Testing/Procedures: 1.  Your physician has requested that you have an echocardiogram. Echocardiography is a painless test that uses sound waves to create images of your heart. It provides your doctor with information about the size and shape of your heart and how well your heart's chambers and valves are working. This procedure takes approximately one hour. There are no restrictions for this procedure.  2.  Your physician has recommended that you wear a Zio monitor. This monitor is a medical device that records the heart's electrical activity. Doctors most often use these monitors to diagnose arrhythmias. Arrhythmias are problems with the speed or rhythm of the heartbeat. The monitor is a small device applied to your chest. You can wear one while you do your normal daily activities. While wearing this monitor if you have any symptoms to push the button and record what you felt. Once you have worn this monitor for the period of time provider prescribed (Usually 14 days), you will return the monitor device in the postage paid box. Once it is returned they will download the data collected and provide [place] with a report which the provider will then review and we will call you with those results. Important tips:  1. Avoid showering during the first 24 hours of wearing the monitor. 2. Avoid excessive sweating to help maximize wear time. 3. Do not submerge the device, no hot tubs, and no swimming pools. 4. Keep any lotions or oils away from the patch. 5. After 24 hours you may shower with the patch on. Take brief showers with your back facing the shower head.  6. Do not remove patch once it has been placed because that will interrupt data and decrease adhesive wear time. 7. Push the button when you have any  symptoms and write down what you were feeling. 8. Once you have completed wearing your monitor, remove and place into box which has postage paid and place in your outgoing mailbox.  9. If for some reason you have misplaced your box then call our office and we can provide another box and/or mail it off for you.         Follow-Up: At [facility], you and your health needs are our priority.  As part of our continuing mission to provide you with exceptional heart care, we have created designated Provider Care Teams.  These Care Teams include your primary Cardiologist (physician) and Advanced Practice Providers (APPs -  Physician Assistants and Nurse Practitioners) who all work together to provide you with the care you need, when you need it.  We recommend signing up for the patient portal called "MyChart".  Sign up information is provided on this After Visit Summary.  MyChart is used to connect with patients for Virtual Visits (Telemedicine).  Patients are able to view lab/test results, encounter notes, upcoming appointments, etc.  Non-urgent messages can be sent to your provider as well.   To learn more about what you can do with MyChart, go to [email or web address].    Your next appointment:   2 month(s)  The format for your next appointment:   In Person  Provider:   You may see Lorine Bears, MD or one of the following Advanced Practice Providers on your designated Care Team:    Nicolasa Ducking, NP  Alycia Rossetti  Dunn, PA-C  Marisue Ivan, PA-C  Cadence [place], [place]  Gillian Shields, NP   Other Instructions

## 2020-03-14 NOTE — Progress Notes (Signed)
Office Visit    Patient Name: Dana [name] Date of Encounter: 03/15/2020  Primary Care Provider:  Jaclyn Shaggy, MD Primary Cardiologist:  Lorine Bears, MD Electrophysiologist:  None   Chief Complaint    Dana [name] is a 74 y.o. female with a hx of coronary artery calcification on CT, chronic diastolic heart failure with pulmonary hypertension, tobacco use, HTN, HLD, sinus tachycardia, venous insufficiency, Dm2, carpal tunnel disease presents today for follow-up of PAH with chief complaint of lightheadedness.  Past Medical History    Past Medical History:  Diagnosis Date  . Coronary artery calcification seen on CT scan    a. 06/2015 CTA Chest: coronary and Ao Ca2+ noted.  . Diabetes mellitus without complication (HCC)   . Diastolic dysfunction    a. 12/2016 Echo: Gr1 DD.  Marland Kitchen Dyspnea on exertion    a. 05/2015 Stress Echo: normal wall motion, nl LV fxn;  b. 12/2016 Echo: EF 60-65%, small cavity szie w/ near cavity obliteration in systole.  LVOT . No change w/ valsalva. Mild conc LVH, Gr1 DD, mild MR, nl RV fxn, PASP .  . Fall at home    DUE TO DEHYDRATION-CARDIAC W/U NEGATIVE IN MARCH 2017 WITH DR [facility]  . Hyperlipidemia   . PAH (pulmonary artery hypertension) (HCC)    a. 12/2016 Echo: PASP .  . Sinus tachycardia    a. 06/2015 Holter: Sinus tach. No significant arrhythmias.  . Tobacco abuse   . Venous insufficiency    a. 12/2016 ABI wnl; b. 12/2016 Venous doppler: venous incompetence in R saphenous femoral jxn and the great saphenous vein. Venous incompetence on L. No DVT.   Past Surgical History:  Procedure Laterality Date  . CARPAL TUNNEL RELEASE Right 01/18/2016   Procedure: CARPAL TUNNEL RELEASE AND GUYUNS CANAL RIGHT HAND;  Surgeon: Kennedy Bucker, MD;  Location: [facility] ORS;  Service: Orthopedics;  Laterality: Right;  . JOINT REPLACEMENT Left 2003   PARTIAL KNEE  . MUSCLE BIOPSY      Allergies  Allergies  Allergen Reactions  .  Azithromycin Other (See Comments)    N/V  . Erythromycin Nausea And Vomiting and Other (See Comments)    History of Present Illness    Dana [name] is a 74 y.o. female with a hx of coronary artery calcification on CT, chronic diastolic heart failure with pulmonary hypertension, tobacco use, HTN, HLD, sinus tachycardia, venous insufficiency, Dm2, carpal tunnel disease last seen 03/09/2019 by Dr. Kirke Corin.  She has chronic discoloration of her toes with normal ABI in the past. Noted venous insufficiency on venous doppler. Evaluated in 2018 for dyspnea with echo showing hyperdynamic LVEF with moderate to severe pulmonary hypertension. Her previous sinus tachycardia has improved with metoprolol. Previous Lexiscan 2019 was normal. Cardiac MRI with no evidence of infiltrative heart disease and EF 58%. Echo 12/2018 with normal LVEF, gr1DD, pulmonary pressures unable to be estimated.   She was last seen 02/2019 by Dr. Kirke Corin. She was recommended for sleep study which she had completed and tells me it shows no sleep apnea. Today presents for follow up and reports feeling overall well since last seen. However, does note 2 week history of lightneadedness. No near syncope, syncope, dizziness. She has been taking Dramamine with no improvement. She stays well hydrated and eats regularly. Lightheadedness is most notable with position changes. No palpitations.   EKGs/Labs/Other Studies Reviewed:   The following studies were reviewed today:   EKG:  EKG is ordered today.  The ekg ordered  today demonstrates NSR 91 bpm with no acute ST/T wave changes.  Unable to exclude left atrial enlargement  Recent Labs: No results found for requested labs within last 8760 hours.  Recent Lipid Panel No results found for: CHOL, TRIG, HDL, CHOLHDL, VLDL, LDLCALC, LDLDIRECT  Home Medications   Current Meds  Medication Sig  . aspirin EC 81 MG tablet Take 1 tablet (81 mg total) by mouth daily.  Marland Kitchen atorvastatin (LIPITOR)  40 MG tablet Take one tab in the pm  . CRANBERRY PO Take 1 tablet by mouth as needed.   . docusate sodium (STOOL SOFTENER) 100 MG capsule Take 100 mg by mouth as needed for mild constipation.  . DULoxetine (CYMBALTA) 30 MG capsule Take 30 mg by mouth daily.   . Homeopathic Products (ALLERGY MEDICINE PO) Take 1 tablet by mouth daily.  Marland Kitchen LORazepam (ATIVAN) 1 MG tablet TK 1 T PO QHS  . metFORMIN (GLUCOPHAGE) 500 MG tablet Take 1,000 mg by mouth 2 (two) times daily with a meal.   . metoprolol tartrate (LOPRESSOR) 50 MG tablet TAKE 1 AND 1/2 TABLETS(75 MG) BY MOUTH TWICE DAILY    Review of Systems  All other systems reviewed and are otherwise negative except as noted above.  Physical Exam    VS:  BP 136/80 (BP Location: Left Arm, Patient Position: Sitting, Cuff Size: Normal)   Pulse 91   Ht 5\' 6"  (1.676 m)   Wt 183 lb (83 kg)   SpO2 98%   BMI 29.54 kg/m  , BMI Body mass index is 29.54 kg/m.   Orthostatic VS for the past 24 hrs (Last 3 readings):  BP- Lying Pulse- Lying BP- Sitting Pulse- Sitting BP- Standing at 0 minutes Pulse- Standing at 0 minutes BP- Standing at 3 minutes Pulse- Standing at 3 minutes  03/14/20 1538 133/82 90 134/85 94 117/83 96 (!) 127/98 98    Wt Readings from Last 3 Encounters:  03/14/20 183 lb (83 kg)  03/09/19 188 lb (85.3 kg)  09/29/18 188 lb (85.3 kg)   GEN: Well nourished, well developed, in no acute distress. HEENT: normal. Neck: Supple, no JVD, carotid bruits, or masses. Cardiac: RRR, no murmurs, rubs, or gallops. No clubbing, cyanosis, edema.  Radials/DP/PT 2+ and equal bilaterally.  Respiratory:  Respirations regular and unlabored, clear to auscultation bilaterally. GI: Soft, nontender, nondistended. MS: No deformity or atrophy. Skin: Warm and dry, no rash. Neuro:  Strength and sensation are intact. Psych: Normal affect.  Assessment & Plan    1. Lightheadedness / Orthostatic hypotension -2-week history of lightheadedness particular with position  changes.  Denies dizziness, near-syncope, syncope.  Orthostatic vital signs today show blood pressure drop from sitting 134/85 to standing 117/83 though HR stable 94 bpm and 96 bpm. Anticipate orthostatic hypotension as the main cause of her lightheadedness.  Encouraged adequate hydration, compression stockings, slow position changes.  Cannot rule out arrhythmia or valvular abnormalities as contributory, 14-day ZIO monitor placed today plan for echo  2. PAH- No worsening shortness of breath.  Encouraged to follow with pulmonology. Update echo, as above.   3. Sinus tachycardia-heart rate well controlled with Lopressor 75 mg twice daily.  4. Tobacco use- Smoking cessation encouraged. Recommend utilization of [id number].  5. Chronic venous insufficiency of lower extremities-lower extremity edema well controlled.  No indication for diuretic at this time.  Encouraged to elevate lower extremities when sitting.  6. Coronary artery calcification on CT scan / Aortic atherosclerosis  -EKG today with no acute ST/2 changes.  GDMT includes aspirin, beta-blocker, statin.  No indication for ischemic evaluation this time.  7. HLD-continue atorvastatin 40 mg daily.   Disposition: Follow up in 2 month(s) with Dr. Kirke Corin or APP  Signed, Alver Sorrow, NP 03/15/2020, 12:46 PM [facility]

## 2020-03-15 ENCOUNTER — Encounter: Payer: Self-pay | Admitting: Family

## 2020-04-06 ENCOUNTER — Other Ambulatory Visit: Payer: PPO

## 2020-04-06 DIAGNOSIS — R42 Dizziness and giddiness: Secondary | ICD-10-CM | POA: Diagnosis not present

## 2020-04-17 ENCOUNTER — Telehealth: Payer: Self-pay | Admitting: *Deleted

## 2020-04-17 NOTE — Telephone Encounter (Signed)
-----   Message from Alver Sorrow, NP sent at 04/14/2020  5:04 PM EST ----- Monitor showed normal sinus rhythm which is a great result! She had one short run of a fast but regular heart beat that is not of concern. She had rare early heart beats which happened less than 1% of the time and are very common and not dangerous.   This is a good result! No evidence of significant arrhythmia.

## 2020-04-17 NOTE — Telephone Encounter (Signed)
Left voicemail message to call back for results.  

## 2020-04-18 NOTE — Telephone Encounter (Signed)
Spoke to pt. Notified of monitor results. Pt verbalized understanding. No questions at this time.

## 2020-04-19 ENCOUNTER — Other Ambulatory Visit: Payer: Self-pay | Admitting: Cardiovascular Disease

## 2020-05-10 ENCOUNTER — Other Ambulatory Visit: Payer: PPO

## 2020-05-12 ENCOUNTER — Ambulatory Visit: Payer: PPO | Admitting: Family

## 2020-05-18 ENCOUNTER — Telehealth: Payer: Self-pay | Admitting: *Deleted

## 2020-05-18 ENCOUNTER — Other Ambulatory Visit: Payer: PPO

## 2020-05-18 NOTE — Telephone Encounter (Signed)
Contacted in attempt to schedule lung screening scan. Patient reports she has other medical tests in the near future but will call me back when she is ready to schedule. She is given my contact number.

## 2020-05-19 ENCOUNTER — Ambulatory Visit: Payer: PPO | Admitting: Physician Assistant

## 2020-06-05 DIAGNOSIS — E119 Type 2 diabetes mellitus without complications: Secondary | ICD-10-CM | POA: Diagnosis not present

## 2020-06-05 DIAGNOSIS — E785 Hyperlipidemia, unspecified: Secondary | ICD-10-CM | POA: Diagnosis not present

## 2020-06-06 DIAGNOSIS — E785 Hyperlipidemia, unspecified: Secondary | ICD-10-CM | POA: Diagnosis not present

## 2020-06-06 DIAGNOSIS — N390 Urinary tract infection, site not specified: Secondary | ICD-10-CM | POA: Diagnosis not present

## 2020-06-06 DIAGNOSIS — E119 Type 2 diabetes mellitus without complications: Secondary | ICD-10-CM | POA: Diagnosis not present

## 2020-07-18 ENCOUNTER — Other Ambulatory Visit: Payer: Self-pay | Admitting: Cardiovascular Disease

## 2020-07-18 NOTE — Telephone Encounter (Signed)
Unable to lvm

## 2020-07-18 NOTE — Telephone Encounter (Signed)
Please reschedule F/U appointment. Patient cancelled last office visit due to illness. Thank you!

## 2020-07-21 NOTE — Telephone Encounter (Signed)
Scheduled for 4-6

## 2020-08-02 ENCOUNTER — Ambulatory Visit: Payer: PPO | Admitting: Family

## 2020-08-02 ENCOUNTER — Other Ambulatory Visit: Payer: Self-pay

## 2020-08-02 ENCOUNTER — Encounter: Payer: Self-pay | Admitting: Family

## 2020-08-02 VITALS — BP 132/84 | HR 87 | Ht 66.0 in | Wt 184.0 lb

## 2020-08-02 DIAGNOSIS — E1165 Type 2 diabetes mellitus with hyperglycemia: Secondary | ICD-10-CM

## 2020-08-02 DIAGNOSIS — I2510 Atherosclerotic heart disease of native coronary artery without angina pectoris: Secondary | ICD-10-CM

## 2020-08-02 DIAGNOSIS — I700 Atherosclerosis of aorta: Secondary | ICD-10-CM

## 2020-08-02 DIAGNOSIS — I2721 Secondary pulmonary arterial hypertension: Secondary | ICD-10-CM

## 2020-08-02 DIAGNOSIS — Z720 Tobacco use: Secondary | ICD-10-CM

## 2020-08-02 DIAGNOSIS — I872 Venous insufficiency (chronic) (peripheral): Secondary | ICD-10-CM | POA: Diagnosis not present

## 2020-08-02 DIAGNOSIS — R0600 Dyspnea, unspecified: Secondary | ICD-10-CM

## 2020-08-02 DIAGNOSIS — R0609 Other forms of dyspnea: Secondary | ICD-10-CM

## 2020-08-02 MED ORDER — METOPROLOL TARTRATE 50 MG PO TABS: ORAL_TABLET | ORAL | 5 refills | Status: DC

## 2020-08-02 NOTE — Patient Instructions (Signed)
Medication Instructions:  Continue your current medications.  *If you need a refill on your cardiac medications before your next appointment, please call your pharmacy*   Lab Work: None ordered today.  We will request lab work from Dr. Arlana Pouch.   Testing/Procedures: Your EKG today was stable compared to previous and showed normal sinus rhythm.   Your physician has requested that you have an echocardiogram. Echocardiography is a painless test that uses sound waves to create images of your heart. It provides your doctor with information about the size and shape of your heart and how well your heart's chambers and valves are working. This procedure takes approximately one hour. There are no restrictions for this procedure.  Follow-Up: At [facility], you and your health needs are our priority.  As part of our continuing mission to provide you with exceptional heart care, we have created designated Provider Care Teams.  These Care Teams include your primary Cardiologist (physician) and Advanced Practice Providers (APPs -  Physician Assistants and Nurse Practitioners) who all work together to provide you with the care you need, when you need it.  We recommend signing up for the patient portal called "MyChart".  Sign up information is provided on this After Visit Summary.  MyChart is used to connect with patients for Virtual Visits (Telemedicine).  Patients are able to view lab/test results, encounter notes, upcoming appointments, etc.  Non-urgent messages can be sent to your provider as well.   To learn more about what you can do with MyChart, go to [email or web address].    Your next appointment:   6 month(s)  The format for your next appointment:   In Person  Provider:   You may see Lorine Bears, MD or one of the following Advanced Practice Providers on your designated Care Team:    Nicolasa Ducking, NP  Eula Listen, PA-C  Marisue Ivan, PA-C  Cadence [place], [place]  Gillian Shields, NP   Other Instructions  Heart Healthy Diet Recommendations: A low-salt diet is recommended. Meats should be grilled, baked, or boiled. Avoid fried foods. Focus on lean protein sources like fish or chicken with vegetables and fruits. The American Heart Association is a Chief Technology Officer!  American Heart Association Diet and Lifeystyle Recommendations   Exercise recommendations: The American Heart Association recommends 150 minutes of moderate intensity exercise weekly. Try 30 minutes of moderate intensity exercise 4-5 times per week. This could include walking, jogging, or swimming.

## 2020-08-02 NOTE — Progress Notes (Signed)
Office Visit    Patient Name: Dana [name] Date of Encounter: 08/02/2020  PCP:  Jaclyn Shaggy, MD   [facility]  Cardiologist:  Lorine Bears, MD  Advanced Practice Provider:  No care team member to display Electrophysiologist:  None   Chief Complaint    Dana [name] is a 75 y.o. female with a hx of coronary artery calcification on CT, chronic diastolic heart failure with pulmonary hypertension, tobacco use, HTN, HLD, sinus tachycardia, venous insufficiency, Dm2, carpal tunnel disease presents today for dyspnea on exertion  Past Medical History    Past Medical History:  Diagnosis Date  . Coronary artery calcification seen on CT scan    a. 06/2015 CTA Chest: coronary and Ao Ca2+ noted.  . Diabetes mellitus without complication (HCC)   . Diastolic dysfunction    a. 12/2016 Echo: Gr1 DD.  Marland Kitchen Dyspnea on exertion    a. 05/2015 Stress Echo: normal wall motion, nl LV fxn;  b. 12/2016 Echo: EF 60-65%, small cavity szie w/ near cavity obliteration in systole.  LVOT . No change w/ valsalva. Mild conc LVH, Gr1 DD, mild MR, nl RV fxn, PASP .  . Fall at home    DUE TO DEHYDRATION-CARDIAC W/U NEGATIVE IN MARCH 2017 WITH DR [facility]  . Hyperlipidemia   . PAH (pulmonary artery hypertension) (HCC)    a. 12/2016 Echo: PASP .  . Sinus tachycardia    a. 06/2015 Holter: Sinus tach. No significant arrhythmias.  . Tobacco abuse   . Venous insufficiency    a. 12/2016 ABI wnl; b. 12/2016 Venous doppler: venous incompetence in R saphenous femoral jxn and the great saphenous vein. Venous incompetence on L. No DVT.   Past Surgical History:  Procedure Laterality Date  . CARPAL TUNNEL RELEASE Right 01/18/2016   Procedure: CARPAL TUNNEL RELEASE AND GUYUNS CANAL RIGHT HAND;  Surgeon: Kennedy Bucker, MD;  Location: [facility] ORS;  Service: Orthopedics;  Laterality: Right;  . JOINT REPLACEMENT Left 2003   PARTIAL KNEE  . MUSCLE BIOPSY      Allergies  Allergies   Allergen Reactions  . Azithromycin Other (See Comments)    N/V  . Erythromycin Nausea And Vomiting and Other (See Comments)    History of Present Illness    Dana [name] is a 75 y.o. female with a hx of coronary artery calcification on CT, chronic diastolic heart failure with pulmonary hypertension, tobacco use, HTN, HLD, sinus tachycardia, venous insufficiency, Dm2, carpal tunnel disease.  She was last seen 03/14/2020.  She has chronic discoloration of her toes with normal ABI in the past. Noted venous insufficiency on venous doppler. Evaluated in 2018 for dyspnea with echo showing hyperdynamic LVEF with moderate to severe pulmonary hypertension. Her previous sinus tachycardia has improved with metoprolol. Previous Lexiscan 2019 was normal. Cardiac MRI with no evidence of infiltrative heart disease and EF 58%. Echo 12/2018 with normal LVEF, gr1DD, pulmonary pressures unable to be estimated.    She was seen 02/2019 by Dr. Kirke Corin. She was recommended for sleep study which she had completed and tells me it shows no sleep apnea.  At follow-up 03/14/2020 she noted feeling overall well though 2-week history of lightheadedness.  No improvement with Dramamine.  Lightheadedness is most notable with position changes.  EKG was without acute findings.  She was recommended for a ZIO monitor and echo.  ZIO monitor 03/14/2020 showing normal sinus rhythm with average heart rate of 67 bpm, one short run of SVT lasting  4 beats at rate of 118 bpm and rare PAC/PVC with burden of less than 1%. She did not have echocardiogram completed.  She presents today for follow-up.  She reports no recurrent dizziness since last seen.  We reviewed her ZIO monitor results.  She reports increasing fatigue and dyspnea on exertion.  She does remain overall sedentary but wonders if something else is contributory.  We discussed that echocardiogram would be a good evaluation and she was agreeable.  She notes occasional  tachypalpitations for which she takes an additional half tablet of metoprolol with good relief.  These tachypalpitations are occasionally associated with some chest discomfort which self resolves when the palpitations resolved.  Checks her blood pressure intermittently when out and about in public with readings routinely below 135/85.    Lab work 02/04/2020 via PCP  Creatinine 0.91, GFR 63, K5.6, NA 141  Total cholesterol 122, triglycerides 204, HDL 43, LDL 46  A1c 6.9  EKGs/Labs/Other Studies Reviewed:   The following studies were reviewed today:  Long-term monitor 02/2020 Normal sinus rhythm with an average heart rate of 67 bpm. One short run of SVT lasting 4 beats with a rate of 118 bpm. Rare PACs and rare PVCs with a burden of less than 1%.  EKG:  EKG is  ordered today.  The ekg ordered today demonstrates sinus rhythm 87 bpm with possible RVH.  No acute ST/T changes.  Recent Labs: No results found for requested labs within last 8760 hours.  Recent Lipid Panel No results found for: CHOL, TRIG, HDL, CHOLHDL, VLDL, LDLCALC, LDLDIRECT   Home Medications   Current Meds  Medication Sig  . aspirin EC 81 MG tablet Take 1 tablet (81 mg total) by mouth daily.  Marland Kitchen atorvastatin (LIPITOR) 40 MG tablet Take one tab in the pm  . busPIRone (BUSPAR) 10 MG tablet Take 10 mg by mouth 2 (two) times daily.  Marland Kitchen CRANBERRY PO Take 1 tablet by mouth as needed.   . docusate sodium (COLACE) 100 MG capsule Take 100 mg by mouth as needed for mild constipation.  . DULoxetine (CYMBALTA) 30 MG capsule Take 30 mg by mouth daily.   . Homeopathic Products (ALLERGY MEDICINE PO) Take 1 tablet by mouth daily.  Marland Kitchen LORazepam (ATIVAN) 1 MG tablet TK 1 T PO QHS  . metFORMIN (GLUCOPHAGE) 500 MG tablet Take 1,000 mg by mouth 2 (two) times daily with a meal.   . metoprolol tartrate (LOPRESSOR) 50 MG tablet TAKE 1 AND 1/2 TABLETS(75 MG) BY MOUTH TWICE DAILY. MUST BE SEEN IN CLINIC FOR FURTHER REFILLS.     Review of  Systems  All other systems reviewed and are otherwise negative except as noted above.  Physical Exam    VS:  BP 132/84   Pulse 87   Ht 5\' 6"  (1.676 m)   Wt 184 lb (83.5 kg)   BMI 29.70 kg/m  , BMI Body mass index is 29.7 kg/m.  Wt Readings from Last 3 Encounters:  08/02/20 184 lb (83.5 kg)  03/14/20 183 lb (83 kg)  03/09/19 188 lb (85.3 kg)     GEN: Well nourished, overweight, well developed, in no acute distress. HEENT: normal. Neck: Supple, no JVD, carotid bruits, or masses. Cardiac: RRR, no murmurs, rubs, or gallops. No clubbing, cyanosis, edema.  Radials/PT 2+ and equal bilaterally.  Respiratory:  Respirations regular and unlabored, clear to auscultation bilaterally. GI: Soft, nontender, nondistended. MS: No deformity or atrophy. Skin: Warm and dry, no rash. Neuro:  Strength and sensation  are intact. Psych: Normal affect.  Assessment & Plan    1. DOE -reports increasing dyspnea on exertion and notes she fatigues more readily than usual.  Plan for echocardiogram.  We discussed possible lab work but she tells me her primary care has checked her thyroid recently and it was within normal limits.  I do anticipate some element of deconditioning is contributory to her  2. PAH-no significant PAH noted on most recent echo 12/2018.  Due to increasing dyspnea on exertion, plan to update echocardiogram.  Pending results may benefit from addition of low-dose Lasix.  3. Sinus tachycardia-well controlled on current dose of metoprolol tartrate 75 mg twice daily.  Refill provided.  4. Tobacco use- Smoking cessation encouraged. Recommend utilization of [id number].  5. DM2 -continue to follow with PCP.  Reports her most recent A1c was below goal of 7.  6. Chronic venous insufficiency of lower extremity-no edema noted on exam today.  Continue conservative measures such as low-salt diet, elevating lower extremities when sitting.  7. Coronary artery calcification on CT scan/aortic  atherosclerosis-EKG today with no acute ST/2 changes.  She reports no symptoms concerning for angina.  Heart healthy diet and regular cardiovascular exercise encouraged.  Continue aspirin, beta-blocker, statin.  8. Hyperlipidemia-LDL goal less than 70 atherosclerosis.  Lipid panel 01/2020 with LDL 58.  Continue atorvastatin 40 mg daily per PCP.  Disposition: Follow up in 6 month(s) with Dr. Kirke Corin or APP   Signed, Alver Sorrow, NP 08/02/2020, 2:12 PM [facility]

## 2020-08-29 ENCOUNTER — Ambulatory Visit (INDEPENDENT_AMBULATORY_CARE_PROVIDER_SITE_OTHER): Payer: PPO

## 2020-08-29 ENCOUNTER — Other Ambulatory Visit: Payer: Self-pay

## 2020-08-29 DIAGNOSIS — I2721 Secondary pulmonary arterial hypertension: Secondary | ICD-10-CM | POA: Diagnosis not present

## 2020-08-30 LAB — ECHOCARDIOGRAM COMPLETE
AR max vel: 2.61 cm2
AV Area VTI: 2.53 cm2
AV Area mean vel: 2.02 cm2
AV Mean grad: 2 mmHg
AV Peak grad: 3.9 mmHg
Ao pk vel: 0.99 m/s
Area-P 1/2: 2.9 cm2
S' Lateral: 2.9 cm
Single Plane A4C EF: 60.8 %

## 2020-08-31 ENCOUNTER — Telehealth: Payer: Self-pay

## 2020-08-31 NOTE — Telephone Encounter (Signed)
Able to reach pt regarding her recent echo, Dana Shields, NP had a chance to review her results and advised   "Echocardiogram shows heart pumping function. Heart shows mild stiffness which is stable compared to previous echocardiogram September 2020. Important to keep heart rate and blood pressure under good control. Normal pulmonary pressures. Trivial mitral regurgitation. Overall good result and stable compared to previous! No indication for further cardiac intervention at this time."  Dana [name] very thankful for the phone call of overall good results, questions and concerns were address, nothing further at this time, will continue to monitor BP and HR, will call in for any fluctuation in pressures.

## 2020-10-16 DIAGNOSIS — E119 Type 2 diabetes mellitus without complications: Secondary | ICD-10-CM | POA: Diagnosis not present

## 2020-10-16 DIAGNOSIS — E785 Hyperlipidemia, unspecified: Secondary | ICD-10-CM | POA: Diagnosis not present

## 2020-10-24 DIAGNOSIS — E119 Type 2 diabetes mellitus without complications: Secondary | ICD-10-CM | POA: Diagnosis not present

## 2020-10-24 DIAGNOSIS — E785 Hyperlipidemia, unspecified: Secondary | ICD-10-CM | POA: Diagnosis not present

## 2021-02-09 DIAGNOSIS — E785 Hyperlipidemia, unspecified: Secondary | ICD-10-CM | POA: Diagnosis not present

## 2021-02-09 DIAGNOSIS — E119 Type 2 diabetes mellitus without complications: Secondary | ICD-10-CM | POA: Diagnosis not present

## 2021-02-21 DIAGNOSIS — E119 Type 2 diabetes mellitus without complications: Secondary | ICD-10-CM | POA: Diagnosis not present

## 2021-02-21 DIAGNOSIS — Z23 Encounter for immunization: Secondary | ICD-10-CM | POA: Diagnosis not present

## 2021-02-21 DIAGNOSIS — E785 Hyperlipidemia, unspecified: Secondary | ICD-10-CM | POA: Diagnosis not present

## 2021-04-25 ENCOUNTER — Other Ambulatory Visit: Payer: Self-pay | Admitting: Cardiovascular Disease

## 2021-04-25 DIAGNOSIS — I2510 Atherosclerotic heart disease of native coronary artery without angina pectoris: Secondary | ICD-10-CM

## 2021-05-01 ENCOUNTER — Other Ambulatory Visit: Payer: Self-pay

## 2021-05-01 ENCOUNTER — Ambulatory Visit: Payer: PPO | Admitting: Cardiovascular Disease

## 2021-05-11 ENCOUNTER — Ambulatory Visit: Payer: PPO | Admitting: Cardiovascular Disease

## 2021-05-11 NOTE — Progress Notes (Deleted)
Cardiology Office Note   Date:  05/11/2021   ID:  Dana [name], DOB 03/19/1946, MRN [id number]  PCP:  Jaclyn Shaggy, MD  Cardiologist:   Lorine Bears, MD   No chief complaint on file.     History of Present Illness: Dana [name] is a 76 y.o. female who is here today for follow-up visit regarding chronic calcifications noted on CT of the chest, chronic and diastolic heart failure with pulmonary hypertension. She has multiple chronic medical conditions including tobacco use, essential hypertension, hyperlipidemia, sinus tachycardia, venous insufficiency, diabetes mellitus and carpal tunnel disease. She has chronic discoloration of her toes with normal ABI in the past.  There was some venous insufficiency on venous Doppler. She was seen in September 2018 for worsening exertional dyspnea.  An echocardiogram showed hyperdynamic LV systolic function with moderate to severe pulmonary hypertension.  Sinus tachycardia improved with metoprolol. Lexiscan Myoview in June 2019 was normal.  Cardiac MRI showed no evidence of infiltrative heart disease.  EF was 58%. She reports stable symptoms overall with no recent chest pain.  She continues to smoke few cigarettes a day.  Repeat echocardiogram in September of this year showed normal LV systolic function, grade 1 diastolic dysfunction.  Pulmonary pressures could not be estimated.  Past Medical History:  Diagnosis Date   Coronary artery calcification seen on CT scan    a. 06/2015 CTA Chest: coronary and Ao Ca2+ noted.   Diabetes mellitus without complication (HCC)    Diastolic dysfunction    a. 12/2016 Echo: Gr1 DD.   Dyspnea on exertion    a. 05/2015 Stress Echo: normal wall motion, nl LV fxn;  b. 12/2016 Echo: EF 60-65%, small cavity szie w/ near cavity obliteration in systole.  LVOT . No change w/ valsalva. Mild conc LVH, Gr1 DD, mild MR, nl RV fxn, PASP .   Fall at home    DUE TO [facility] W/U NEGATIVE  IN MARCH 2017 WITH DR [facility]   Hyperlipidemia    PAH (pulmonary artery hypertension) (HCC)    a. 12/2016 Echo: PASP .   Sinus tachycardia    a. 06/2015 Holter: Sinus tach. No significant arrhythmias.   Tobacco abuse    Venous insufficiency    a. 12/2016 ABI wnl; b. 12/2016 Venous doppler: venous incompetence in R saphenous femoral jxn and the great saphenous vein. Venous incompetence on L. No DVT.    Past Surgical History:  Procedure Laterality Date   CARPAL TUNNEL RELEASE Right 01/18/2016   Procedure: CARPAL TUNNEL RELEASE AND GUYUNS CANAL RIGHT HAND;  Surgeon: Kennedy Bucker, MD;  Location: [facility] ORS;  Service: Orthopedics;  Laterality: Right;   JOINT REPLACEMENT Left 2003   PARTIAL KNEE   MUSCLE BIOPSY       Current Outpatient Medications  Medication Sig Dispense Refill   aspirin EC 81 MG tablet Take 1 tablet (81 mg total) by mouth daily. 90 tablet 3   atorvastatin (LIPITOR) 40 MG tablet Take one tab in the pm     busPIRone (BUSPAR) 10 MG tablet Take 10 mg by mouth 2 (two) times daily.     CRANBERRY PO Take 1 tablet by mouth as needed.      docusate sodium (COLACE) 100 MG capsule Take 100 mg by mouth as needed for mild constipation.     DULoxetine (CYMBALTA) 30 MG capsule Take 30 mg by mouth daily.      Homeopathic Products (ALLERGY MEDICINE PO) Take 1 tablet by mouth daily.  LORazepam (ATIVAN) 1 MG tablet TK 1 T PO QHS     metFORMIN (GLUCOPHAGE) 500 MG tablet Take 1,000 mg by mouth 2 (two) times daily with a meal.      metoprolol tartrate (LOPRESSOR) 50 MG tablet TAKE 1 AND 1/2 TABLETS(75 MG) BY MOUTH TWICE DAILY 90 tablet 0   No current facility-administered medications for this visit.    Allergies:   Azithromycin and Erythromycin    Social History:  The patient  reports that she has been smoking cigarettes. She has a 39.00 pack-year smoking history. She has never used smokeless tobacco. She reports that she does not drink alcohol and does not use drugs.   Family  History:  The patient's family history includes Heart disease in her father.    ROS:  Please see the history of present illness.   Otherwise, review of systems are positive for none.   All other systems are reviewed and negative.    PHYSICAL EXAM: VS:  There were no vitals taken for this visit. , BMI There is no height or weight on file to calculate BMI. GEN: Well nourished, well developed, in no acute distress  HEENT: normal  Neck: no JVD, carotid bruits, or masses Cardiac: RRR But tachycardic; no  rubs, or gallops, trace edema . 2/6 systolic ejection murmur in the aortic area Respiratory:  clear to auscultation bilaterally, normal work of breathing GI: soft, nontender, nondistended, + BS MS: no deformity or atrophy  Skin: warm and dry, no rash Neuro:  Strength and sensation are intact Psych: euthymic mood, full affect   EKG:  EKG is ordered today. The ekg ordered today demonstrates normal sinus rhythm with no significant ST or T wave changes.   Recent Labs: No results found for requested labs within last 8760 hours.    Lipid Panel No results found for: CHOL, TRIG, HDL, CHOLHDL, VLDL, LDLCALC, LDLDIRECT    Wt Readings from Last 3 Encounters:  08/02/20 184 lb (83.5 kg)  03/14/20 183 lb (83 kg)  03/09/19 188 lb (85.3 kg)        PAD Screen 01/02/2017  Previous PAD dx? No  Previous surgical procedure? No  Pain with walking? Yes  Subsides with rest? No  Feet/toe relief with dangling? No  Painful, non-healing ulcers? No  Extremities discolored? Yes      ASSESSMENT AND PLAN:  1.  Pulmonary hypertension: Recent echocardiogram could not estimate pulmonary pressure.  Patient is awaiting pulmonary evaluation to get PFTs and likely sleep study done.  2.  Sinus tachycardia: Controlled with metoprolol.    3.  Tobacco use: I discussed the importance of smoking cessation.    4.  Chronic venous insufficiency of the lower extremities: Seems to be stable.    5.   Hyperlipidemia: Currently on atorvastatin 40 mg daily.    Disposition:   FU  in 12 months  Signed,  Lorine Bears, MD  05/11/2021 1:16 PM    [facility]

## 2021-05-25 ENCOUNTER — Other Ambulatory Visit: Payer: Self-pay | Admitting: Cardiovascular Disease

## 2021-05-25 ENCOUNTER — Other Ambulatory Visit: Payer: Self-pay | Admitting: *Deleted

## 2021-05-25 DIAGNOSIS — I2510 Atherosclerotic heart disease of native coronary artery without angina pectoris: Secondary | ICD-10-CM

## 2021-05-25 NOTE — Telephone Encounter (Signed)
Attempted to schedule no ans no vm  

## 2021-05-28 MED ORDER — METOPROLOL TARTRATE 50 MG PO TABS: 75.0000 mg | ORAL_TABLET | Freq: Two times a day (BID) | ORAL | 0 refills | Status: DC

## 2021-05-28 NOTE — Telephone Encounter (Signed)
Scheduled

## 2021-05-30 ENCOUNTER — Ambulatory Visit: Payer: PPO | Admitting: Medical

## 2021-05-30 NOTE — Progress Notes (Deleted)
Cardiology Office Note:    Date:  05/30/2021   ID:  Dana Loaderriscilla A Krabbenhoft, DOB [birth year], MRN [id number]  PCP:  Jaclyn Shaggyate, Denny C, MD  [facility] Cardiologist:  Lorine BearsMuhammad Arida, MD  [facility] Electrophysiologist:  None   Referring MD: Jaclyn Shaggyate, Denny C, MD   Chief Complaint: 6 month follow-up  History of Present Illness:    Dana [name] is a 76 y.o. female with a hx of coronary artery calcification on CT, chronic diastolic heart failure with pulmonary hypertension, tobacco use, HTN, HLD, sinus tachycardia, venous insufficiency, DM2, carpal tunnel syndrome who presents for 6 month follow-up.   She has chronic discoloration of her toes with normal ABI in the past. Noted venous insufficiency on venous doppler. Evaluated in 2018 for dyspnea with echo showing hyperdynamic LVEF with moderate to severe pulmonary hypertension. Her previous sinus tachycardia has improved with metoprolol. Previous Lexiscan 2019 was normal. Cardiac MRI with no evidence of infiltrative heart disease and EF 58%. Echo 12/2018 with normal LVEF, gr1DD, pulmonary pressures unable to be estimated.    She was seen 02/2019 by Dr. Kirke CorinArida. She was recommended for sleep study which she had completed and tells me it shows no sleep apnea.  At follow-up 03/14/2020 she noted feeling overall well though 2-week history of lightheadedness.  No improvement with Dramamine.  Lightheadedness is most notable with position changes.  EKG was without acute findings.  She was recommended for a ZIO monitor and echo.  ZIO monitor 03/14/2020 showing normal sinus rhythm with average heart rate of 67 bpm, one short run of SVT lasting 4 beats at rate of 118 bpm and rare PAC/PVC with burden of less than 1%. She did not have echocardiogram completed.   Last seen 08/02/20 and reported DOE. An echo was ordered which showed LVEF 60-65%, G2DD, trivial MR.   Today,     Past Medical History:  Diagnosis Date   Coronary artery calcification seen on CT  scan    a. 06/2015 CTA Chest: coronary and Ao Ca2+ noted.   Diabetes mellitus without complication (HCC)    Diastolic dysfunction    a. 12/2016 Echo: Gr1 DD.   Dyspnea on exertion    a. 05/2015 Stress Echo: normal wall motion, nl LV fxn;  b. 12/2016 Echo: EF 60-65%, small cavity szie w/ near cavity obliteration in systole.  LVOT 67mmHg. No change w/ valsalva. Mild conc LVH, Gr1 DD, mild MR, nl RV fxn, PASP 67mmHg.   Fall at home    [facility]   Hyperlipidemia    PAH (pulmonary artery hypertension) (HCC)    a. 12/2016 Echo: PASP 67mmHg.   Sinus tachycardia    a. 06/2015 Holter: Sinus tach. No significant arrhythmias.   Tobacco abuse    Venous insufficiency    a. 12/2016 ABI wnl; b. 12/2016 Venous doppler: venous incompetence in R saphenous femoral jxn and the great saphenous vein. Venous incompetence on L. No DVT.    Past Surgical History:  Procedure Laterality Date   CARPAL TUNNEL RELEASE Right 01/18/2016   Procedure: CARPAL TUNNEL RELEASE AND GUYUNS CANAL RIGHT HAND;  Surgeon: Kennedy BuckerMichael Menz, MD;  Location: [facility] ORS;  Service: Orthopedics;  Laterality: Right;   JOINT REPLACEMENT Left 2003   PARTIAL KNEE   MUSCLE BIOPSY      Current Medications: No outpatient medications have been marked as taking for the 05/30/21 encounter (Appointment) with Fransico MichaelFurth, Abdulai Blaylock H, PA-C.     Allergies:  Azithromycin and Erythromycin   Social History   Socioeconomic History   Marital status: Divorced    Spouse name: Not on file   Number of children: Not on file   Years of education: Not on file   Highest education level: Not on file  Occupational History   Not on file  Tobacco Use   Smoking status: Some Days    Packs/day: 0.75    Years: 52.00    Pack years: 39.00    Types: Cigarettes   Smokeless tobacco: Never   Tobacco comments:    1-2 CIGARETTES EVERY OTHER DAY  Vaping Use   Vaping Use: Never used  Substance and Sexual Activity   Alcohol  use: No    Comment: RARE   Drug use: No   Sexual activity: Never  Other Topics Concern   Not on file  Social History Narrative   Not on file   Social Determinants of Health   Financial Resource Strain: Not on file  Food Insecurity: Not on file  Transportation Needs: Not on file  Physical Activity: Not on file  Stress: Not on file  Social Connections: Not on file     Family History: The patient's family history includes Heart disease in her father.  ROS:   Please see the history of present illness.     All other systems reviewed and are negative.  EKGs/Labs/Other Studies Reviewed:    The following studies were reviewed today:  Echo 08/29/20  1. Left ventricular ejection fraction, by estimation, is 60 to 65%. The  left ventricle has normal function. Left ventricular endocardial border  not optimally defined to evaluate regional wall motion. Left ventricular  diastolic parameters are consistent  with Grade I diastolic dysfunction (impaired relaxation).   2. Right ventricular systolic function is normal. The right ventricular  size is normal. There is normal pulmonary artery systolic pressure.   3. The mitral valve was not well visualized. Trivial mitral valve  regurgitation. No evidence of mitral stenosis.   4. The aortic valve was not well visualized. Aortic valve regurgitation  is not visualized. No aortic stenosis is present.   5. The inferior vena cava is normal in size with greater than 50%  respiratory variability, suggesting right atrial pressure of 3 mmHg.   EKG:  EKG is *** ordered today.  The ekg ordered today demonstrates ***  Recent Labs: No results found for requested labs within last 8760 hours.  Recent Lipid Panel No results found for: CHOL, TRIG, HDL, CHOLHDL, VLDL, LDLCALC, LDLDIRECT   Risk Assessment/Calculations:   {Does this patient have ATRIAL FIBRILLATION?[phone]}   Physical Exam:    VS:  There were no vitals taken for this visit.     Wt Readings from Last 3 Encounters:  08/02/20 184 lb (83.5 kg)  03/14/20 183 lb (83 kg)  03/09/19 188 lb (85.3 kg)     GEN: *** Well nourished, well developed in no acute distress HEENT: Normal NECK: No JVD; No carotid bruits LYMPHATICS: No lymphadenopathy CARDIAC: ***RRR, no murmurs, rubs, gallops RESPIRATORY:  Clear to auscultation without rales, wheezing or rhonchi  ABDOMEN: Soft, non-tender, non-distended MUSCULOSKELETAL:  No edema; No deformity  SKIN: Warm and dry NEUROLOGIC:  Alert and oriented x 3 PSYCHIATRIC:  Normal affect   ASSESSMENT:    No diagnosis found. PLAN:    In order of problems listed above:  DOE  PAH  Sinus tachycardia  Tobacco use  DM2  Chronic venous insufficiency  Coronary artery calcification on CT/aortic  atherosclerosis  Disposition: Follow up {follow up:15908} with ***   Shared Decision Making/Informed Consent   {Are you ordering a CV Procedure (e.g. stress test, cath, DCCV, TEE, etc)?   Press F2        :[id number]}    Signed, Niketa Turner Ardelle Lesches  05/30/2021 7:51 AM    [facility]

## 2021-06-13 NOTE — Progress Notes (Signed)
Cardiology Office Note:    Date:  06/15/2021   ID:  Dana [name], DOB October 16, 1945, MRN [id number]  PCP:  Albina Billet, MD  [facility] Cardiologist:  Kathlyn Sacramento, MD  [facility] Electrophysiologist:  None   Referring MD: Albina Billet, MD   Chief Complaint: 6 month follow-up  History of Present Illness:    Dana [name] is a 76 y.o. female with a hx of  with a hx of coronary artery calcification on CT, chronic diastolic heart failure with pulmonary hypertension, tobacco use, HTN, HLD, sinus tachycardia, venous insufficiency, Dm2, carpal tunnel disease presents today for dyspnea on exertion.  She has chronic discoloration of her toes with normal ABI in the past. Noted venous insufficiency on venous doppler. Evaluated in 2018 for dyspnea with echo showing hyperdynamic LVEF with moderate to severe pulmonary hypertension. Her previous sinus tachycardia has improved with metoprolol. Previous Lexiscan 2019 was normal. Cardiac MRI with no evidence of infiltrative heart disease and EF 58%. Echo 12/2018 with normal LVEF, gr1DD, pulmonary pressures unable to be estimated.    She was seen 02/2019 by Dr. Fletcher Anon. She was recommended for sleep study which she had completed and tells me it shows no sleep apnea.  At follow-up 03/14/2020 she noted feeling overall well though 2-week history of lightheadedness.  No improvement with Dramamine.  Lightheadedness is most notable with position changes.  EKG was without acute findings.  She was recommended for a ZIO monitor and echo.  ZIO monitor 03/14/2020 showing normal sinus rhythm with average heart rate of 67 bpm, one short run of SVT lasting 4 beats at rate of 118 bpm and rare PAC/PVC with burden of less than 1%. She did not have echocardiogram completed.  Last seen 08/02/20 and had worsening fatigue and DOE. Echo was ordered May 2022 which showed EF 60-65%, Grade 1 DD and trivial mitral regurgitation.   Today, she states that her  shortness of breath and fatigue has worsened over the last couple of months.  She also states that she has a racing heartbeat and when this occurs she usually takes an extra half a tab of her metoprolol.  She is currently on 75 mg twice daily of metoprolol.  EKG today demonstrates normal sinus rhythm, rate 99 bpm.  She states that even taking a shower is extremely tiring.  She did have a recent echocardiogram back in May 2022 which showed normal EF, grade 1 DD, and trivial mitral regurgitation.  She also wore a monitor back in November 2021 and results are above.  She denies any chest pain at this time.  Reports no chest pain, pressure, or tightness. No edema, orthopnea, PND.    Past Medical History:  Diagnosis Date   Coronary artery calcification seen on CT scan    a. 06/2015 CTA Chest: coronary and Ao Ca2+ noted.   Diabetes mellitus without complication ([place])    Diastolic dysfunction    a. 12/2016 Echo: Gr1 DD.   Dyspnea on exertion    a. 05/2015 Stress Echo: normal wall motion, nl LV fxn;  b. 12/2016 Echo: EF 60-65%, small cavity szie w/ near cavity obliteration in systole.  LVOT 25mmHg. No change w/ valsalva. Mild conc LVH, Gr1 DD, mild MR, nl RV fxn, PASP 39mmHg.   Fall at home    DUE TO [facility] W/U NEGATIVE IN MARCH 2017 WITH DR [facility]   Hyperlipidemia    PAH (pulmonary artery hypertension) ([place])    a. 12/2016 Echo: PASP 6mmHg.  Sinus tachycardia    a. 06/2015 Holter: Sinus tach. No significant arrhythmias.   Tobacco abuse    Venous insufficiency    a. 12/2016 ABI wnl; b. 12/2016 Venous doppler: venous incompetence in R saphenous femoral jxn and the great saphenous vein. Venous incompetence on L. No DVT.    Past Surgical History:  Procedure Laterality Date   CARPAL TUNNEL RELEASE Right 01/18/2016   Procedure: CARPAL TUNNEL RELEASE AND GUYUNS CANAL RIGHT HAND;  Surgeon: Hessie Knows, MD;  Location: [facility] ORS;  Service: Orthopedics;  Laterality: Right;   JOINT REPLACEMENT Left 2003    PARTIAL KNEE   MUSCLE BIOPSY      Current Medications: Current Meds  Medication Sig   aspirin EC 81 MG tablet Take 1 tablet (81 mg total) by mouth daily.   busPIRone (BUSPAR) 10 MG tablet Take 10 mg by mouth 2 (two) times daily.   CRANBERRY PO Take 1 tablet by mouth as needed.    docusate sodium (COLACE) 100 MG capsule Take 100 mg by mouth as needed for mild constipation.   DULoxetine (CYMBALTA) 30 MG capsule Take 60 mg by mouth daily.   Homeopathic Products (ALLERGY MEDICINE PO) Take 1 tablet by mouth daily.   LORazepam (ATIVAN) 1 MG tablet TK 1 T PO QHS   metFORMIN (GLUCOPHAGE) 500 MG tablet Take 1,000 mg by mouth 2 (two) times daily with a meal.    [DISCONTINUED] atorvastatin (LIPITOR) 40 MG tablet Take one tab in the pm   [DISCONTINUED] metoprolol tartrate (LOPRESSOR) 50 MG tablet Take 1.5 tablets (75 mg total) by mouth 2 (two) times daily.     Allergies:   Azithromycin and Erythromycin   Social History   Socioeconomic History   Marital status: Divorced    Spouse name: Not on file   Number of children: Not on file   Years of education: Not on file   Highest education level: Not on file  Occupational History   Not on file  Tobacco Use   Smoking status: Some Days    Packs/day: 0.75    Years: 52.00    Pack years: 39.00    Types: Cigarettes   Smokeless tobacco: Never   Tobacco comments:    1-2 CIGARETTES EVERY OTHER DAY  Vaping Use   Vaping Use: Never used  Substance and Sexual Activity   Alcohol use: No    Comment: RARE   Drug use: No   Sexual activity: Never  Other Topics Concern   Not on file  Social History Narrative   Not on file   Social Determinants of Health   Financial Resource Strain: Not on file  Food Insecurity: Not on file  Transportation Needs: Not on file  Physical Activity: Not on file  Stress: Not on file  Social Connections: Not on file     Family History: The patient's family history includes Heart disease in her father.  ROS:    Please see the history of present illness.     All other systems reviewed and are negative.  EKGs/Labs/Other Studies Reviewed:    The following studies were reviewed today:  Echo 08/2020 1. Left ventricular ejection fraction, by estimation, is 60 to 65%. The  left ventricle has normal function. Left ventricular endocardial border  not optimally defined to evaluate regional wall motion. Left ventricular  diastolic parameters are consistent  with Grade I diastolic dysfunction (impaired relaxation).   2. Right ventricular systolic function is normal. The right ventricular  size is normal. There is  normal pulmonary artery systolic pressure.   3. The mitral valve was not well visualized. Trivial mitral valve  regurgitation. No evidence of mitral stenosis.   4. The aortic valve was not well visualized. Aortic valve regurgitation  is not visualized. No aortic stenosis is present.   5. The inferior vena cava is normal in size with greater than 50%  respiratory variability, suggesting right atrial pressure of 3 mmHg.   Heart monitor 02/2020 14-day ZIO monitor:   Normal sinus rhythm with an average heart rate of 67 bpm. One short run of SVT lasting 4 beats with a rate of 118 bpm. Rare PACs and rare PVCs with a burden of less than 1%.  MPI 09/2017 Narrative & Impression  There was no ST segment deviation noted during stress. The study is normal. This is a low risk study. The left ventricular ejection fraction is hyperdynamic (>65%).    EKG:  EKG is  ordered today.  The ekg ordered today demonstrates NSR rate 99 bpm  Recent Labs: No results found for requested labs within last 8760 hours.  Recent Lipid Panel No results found for: CHOL, TRIG, HDL, CHOLHDL, VLDL, LDLCALC, LDLDIRECT    Physical Exam:    VS:  BP 128/78 (BP Location: Left Arm, Patient Position: Sitting, Cuff Size: Normal)    Pulse 82    Ht 5\' 6"  (1.676 m)    Wt 178 lb (80.7 kg)    SpO2 98%    BMI 28.73 kg/m     Wt  Readings from Last 3 Encounters:  06/15/21 178 lb (80.7 kg)  08/02/20 184 lb (83.5 kg)  03/14/20 183 lb (83 kg)     GEN:  Well nourished, well developed in no acute distress HEENT: Normal NECK: No JVD; No carotid bruits LYMPHATICS: No lymphadenopathy CARDIAC: RRR, no murmurs, rubs, gallops RESPIRATORY:  Clear to auscultation without rales, wheezing or rhonchi  ABDOMEN: Soft, non-tender, non-distended MUSCULOSKELETAL:  No edema; No deformity  SKIN: Warm and dry NEUROLOGIC:  Alert and oriented x 3 PSYCHIATRIC:  Normal affect   ASSESSMENT:    1. Coronary artery disease involving native heart without angina pectoris, unspecified vessel or lesion type   2. Dyspnea on exertion   3. Type 2 diabetes mellitus with hyperglycemia, without long-term current use of insulin ([place])   4. PAH (pulmonary artery hypertension) ([place])   5. Coronary artery calcification seen on CT scan   6. Palpitations    PLAN:    In order of problems listed above:  DOE -She is having some fatigue and tires easily.  -Her last echocardiogram showed normal EF, grade 1DD -Due to her increasing symptoms we have ordered a repeat Echocardiogram   2. PAH -not present on recent echocardiogram 5/22 -no signs of right sided heart failure  3. Sinus tachycardia -NSR in the 80s today -She had a monitor which showed average heartbeat of 67 bpm, 1 short run of SVT lasting 4 beats with a rate of 118 bpm, and rare PACs and PVCs -Since her racing heartbeat seems like it is increasing in frequency and in intensity we will place a ZIO monitor today for 14 days   4. Tobacco use -cessation advised  5. DM2 -A1C 8.0 -treatment per PCP   6. Coronary artery calcification on CT scan back in 2017 -She is not having any chest pain -She is having SOB and fatigue -Will order echocardiogram and if EF is reduced will pursue an ischemic work-up   Disposition: Follow up 6-8 weeks  with Dr. Fletcher Anon or APP     Signed, Elgie Collard, PA-C  06/15/2021 4:36 PM    [facility]

## 2021-06-15 ENCOUNTER — Ambulatory Visit: Payer: PPO | Admitting: Physician Assistant

## 2021-06-15 ENCOUNTER — Encounter: Payer: Self-pay | Admitting: Medical

## 2021-06-15 ENCOUNTER — Ambulatory Visit (INDEPENDENT_AMBULATORY_CARE_PROVIDER_SITE_OTHER): Payer: PPO

## 2021-06-15 ENCOUNTER — Other Ambulatory Visit: Payer: Self-pay

## 2021-06-15 VITALS — BP 128/78 | HR 82 | Ht 66.0 in | Wt 178.0 lb

## 2021-06-15 DIAGNOSIS — I2510 Atherosclerotic heart disease of native coronary artery without angina pectoris: Secondary | ICD-10-CM | POA: Diagnosis not present

## 2021-06-15 DIAGNOSIS — I2721 Secondary pulmonary arterial hypertension: Secondary | ICD-10-CM

## 2021-06-15 DIAGNOSIS — R002 Palpitations: Secondary | ICD-10-CM

## 2021-06-15 DIAGNOSIS — E1165 Type 2 diabetes mellitus with hyperglycemia: Secondary | ICD-10-CM | POA: Diagnosis not present

## 2021-06-15 DIAGNOSIS — R0609 Other forms of dyspnea: Secondary | ICD-10-CM | POA: Diagnosis not present

## 2021-06-15 MED ORDER — METOPROLOL TARTRATE 50 MG PO TABS: 75.0000 mg | ORAL_TABLET | Freq: Two times a day (BID) | ORAL | 3 refills | Status: DC

## 2021-06-15 MED ORDER — ATORVASTATIN CALCIUM 40 MG PO TABS: 40.0000 mg | ORAL_TABLET | Freq: Every day | ORAL | 3 refills | Status: DC

## 2021-06-15 NOTE — Patient Instructions (Signed)
Medication Instructions:   Your physician recommends that you continue on your current medications as directed. Please refer to the Current Medication list given to you today.   *If you need a refill on your cardiac medications before your next appointment, please call your pharmacy*   Lab Work: None ordered  If you have labs (blood work) drawn today and your tests are completely normal, you will receive your results only by: MyChart Message (if you have MyChart) OR A paper copy in the mail If you have any lab test that is abnormal or we need to change your treatment, we will call you to review the results.   Testing/Procedures:  Echocardiogram - Your physician has requested that you have an echocardiogram in 4-5 weeks. Echocardiography is a painless test that uses sound waves to create images of your heart. It provides your doctor with information about the size and shape of your heart and how well your hearts chambers and valves are working. This procedure takes approximately one hour. There are no restrictions for this procedure.   Your provider has ordered a heart monitor to wear for 14 days. This will be mailed to your home with instructions on placement. Once you have finished the time frame requested, you will return monitor in box provided.     Follow-Up: At [facility], you and your health needs are our priority.  As part of our continuing mission to provide you with exceptional heart care, we have created designated Provider Care Teams.  These Care Teams include your primary Cardiologist (physician) and Advanced Practice Providers (APPs -  Physician Assistants and Nurse Practitioners) who all work together to provide you with the care you need, when you need it.  We recommend signing up for the patient portal called "MyChart".  Sign up information is provided on this After Visit Summary.  MyChart is used to connect with patients for Virtual Visits (Telemedicine).  Patients  are able to view lab/test results, encounter notes, upcoming appointments, etc.  Non-urgent messages can be sent to your provider as well.   To learn more about what you can do with MyChart, go to [email or web address].    Your next appointment:   6-8 week(s)  The format for your next appointment:   In Person  Provider:   You may see Lorine Bears, MD or one of the following Advanced Practice Providers on your designated Care Team:   Nicolasa Ducking, NP Eula Listen, PA-C Cadence Fransico Michael, PA-C :1}    Other Instructions N/A

## 2021-06-22 DIAGNOSIS — R002 Palpitations: Secondary | ICD-10-CM

## 2021-07-20 ENCOUNTER — Other Ambulatory Visit: Payer: PPO

## 2021-07-26 ENCOUNTER — Other Ambulatory Visit: Payer: Self-pay | Admitting: Family Medicine

## 2021-07-26 DIAGNOSIS — M4807 Spinal stenosis, lumbosacral region: Secondary | ICD-10-CM

## 2021-07-26 DIAGNOSIS — M5416 Radiculopathy, lumbar region: Secondary | ICD-10-CM

## 2021-07-27 ENCOUNTER — Ambulatory Visit (INDEPENDENT_AMBULATORY_CARE_PROVIDER_SITE_OTHER): Payer: PPO

## 2021-07-27 DIAGNOSIS — R0609 Other forms of dyspnea: Secondary | ICD-10-CM | POA: Diagnosis not present

## 2021-07-27 LAB — ECHOCARDIOGRAM COMPLETE
AR max vel: 2.44 cm2
AV Area VTI: 2.27 cm2
AV Area mean vel: 2.3 cm2
AV Mean grad: 5 mmHg
AV Peak grad: 7.4 mmHg
Ao pk vel: 1.36 m/s
Area-P 1/2: 3.34 cm2
Calc EF: 54.4 %
S' Lateral: 2.6 cm
Single Plane A2C EF: 54.4 %
Single Plane A4C EF: 56.6 %

## 2021-07-30 ENCOUNTER — Telehealth (HOSPITAL_BASED_OUTPATIENT_CLINIC_OR_DEPARTMENT_OTHER): Payer: Self-pay

## 2021-07-30 NOTE — Telephone Encounter (Addendum)
Results called to patient who verbalizes understanding!  ? ? ? ?----- Message from Alver Sorrow, NP sent at 07/27/2021  8:33 PM EDT ----- ?Echo shows normal heart pumping function. Mild thickening and stiffness of heart muscle. Mild leaking of mitral and aortic valves. Continue optimal BP control to prevent progression. No significant abnormality to cause fatigue or dyspnea. Follow up as scheduled. ?

## 2021-08-06 ENCOUNTER — Ambulatory Visit
Admission: RE | Admit: 2021-08-06 | Discharge: 2021-08-06 | Disposition: A | Discharge status: Home / Self Care | Payer: PPO | Source: Ambulatory Visit | Attending: Family Medicine | Admitting: Family Medicine

## 2021-08-06 DIAGNOSIS — M5416 Radiculopathy, lumbar region: Secondary | ICD-10-CM

## 2021-08-06 DIAGNOSIS — M4807 Spinal stenosis, lumbosacral region: Secondary | ICD-10-CM

## 2021-08-10 ENCOUNTER — Encounter: Payer: Self-pay | Admitting: Medical

## 2021-08-10 ENCOUNTER — Ambulatory Visit: Payer: PPO | Admitting: Medical

## 2021-08-10 VITALS — BP 120/80 | HR 76 | Ht 66.0 in | Wt 180.0 lb

## 2021-08-10 DIAGNOSIS — R002 Palpitations: Secondary | ICD-10-CM

## 2021-08-10 DIAGNOSIS — R0609 Other forms of dyspnea: Secondary | ICD-10-CM

## 2021-08-10 DIAGNOSIS — I2510 Atherosclerotic heart disease of native coronary artery without angina pectoris: Secondary | ICD-10-CM

## 2021-08-10 DIAGNOSIS — I2721 Secondary pulmonary arterial hypertension: Secondary | ICD-10-CM

## 2021-08-10 DIAGNOSIS — I4510 Unspecified right bundle-branch block: Secondary | ICD-10-CM

## 2021-08-10 DIAGNOSIS — Z720 Tobacco use: Secondary | ICD-10-CM | POA: Diagnosis not present

## 2021-08-10 NOTE — Progress Notes (Signed)
?Cardiology Office Note:   ? ?Date:  08/10/2021  ? ?ID:  EVVIE [name], DOB Oct 18, 1945, MRN [id number] ? ?PCP:  Jaclyn Shaggy, MD  ?[facility] Cardiologist:  Lorine Bears, MD  ?[facility] Electrophysiologist:  None  ? ?Referring MD: Jaclyn Shaggy, MD  ? ?Chief Complaint: F/u zio and echo ? ?History of Present Illness:   ? ?Dana [name] is a 76 y.o. female with a hx of with a hx of coronary artery calcification on CT, chronic diastolic heart failure with pulmonary hypertension, tobacco use, HTN, HLD, sinus tachycardia, venous insufficiency, Dm2, carpal tunnel disease presents today for dyspnea on exertion. ?  ?She has chronic discoloration of her toes with normal ABI in the past. Noted venous insufficiency on venous doppler. Evaluated in 2018 for dyspnea with echo showing hyperdynamic LVEF with moderate to severe pulmonary hypertension. Her previous sinus tachycardia has improved with metoprolol. Previous Lexiscan 2019 was normal. Cardiac MRI with no evidence of infiltrative heart disease and EF 58%. Echo 12/2018 with normal LVEF, gr1DD, pulmonary pressures unable to be estimated.  ?  ?She was seen 02/2019 by Dr. Kirke Corin. She was recommended for sleep study which she had completed and tells me it shows no sleep apnea.  At follow-up 03/14/2020 she noted feeling overall well though 2-week history of lightheadedness.  No improvement with Dramamine.  Lightheadedness is most notable with position changes.  EKG was without acute findings.  She was recommended for a ZIO monitor and echo.  ZIO monitor 03/14/2020 showing normal sinus rhythm with average heart rate of 67 bpm, one short run of SVT lasting 4 beats at rate of 118 bpm and rare PAC/PVC with burden of less than 1%. She did not have echocardiogram completed. ?  ?Seen 08/02/20 and had worsening fatigue and DOE. Echo was ordered May 2022 which showed EF 60-65%, Grade 1 DD and trivial mitral regurgitation.  ? ?Last seen 06/15/21 and reported worsening  SOB and fatigue along with palpitations. Echo and heart monitor were ordered. Echo was unchanged. Heart monitor showed predominately NSR with BBB/IVCD, 1 run of SVT lasting 4 beats, rare PACs and PVCs.  ? ?Today, heart monitor and echo were reviewed. She self-increased metoprolol after the last visit. Since then palpitations have decreased, now rare. Mainly feels them when she exerts herself. Breathing is still short on major exertion. She follows PCP, he gets blood work every few months. She smokes intermittently, sometimes once a week or twice a week. No lower leg edema, orthopnea, pnd. EKG shows NSR with new RBBB.  ? ?Past Medical History:  ?Diagnosis Date  ? Coronary artery calcification seen on CT scan   ? a. 06/2015 CTA Chest: coronary and Ao Ca2+ noted.  ? Diabetes mellitus without complication ([facility])   ? Diastolic dysfunction   ? a. 12/2016 Echo: Gr1 DD.  ? Dyspnea on exertion   ? a. 05/2015 Stress Echo: normal wall motion, nl LV fxn;  b. 12/2016 Echo: EF 60-65%, small cavity szie w/ near cavity obliteration in systole.  LVOT . No change w/ valsalva. Mild conc LVH, Gr1 DD, mild MR, nl RV fxn, PASP .  ? Fall at home   ? DUE TO DEHYDRATION-CARDIAC W/U NEGATIVE IN MARCH 2017 WITH DR [facility]  ? Hyperlipidemia   ? PAH (pulmonary artery hypertension) ([facility])   ? a. 12/2016 Echo: PASP .  ? Sinus tachycardia   ? a. 06/2015 Holter: Sinus tach. No significant arrhythmias.  ? Tobacco abuse   ? Venous insufficiency   ?  a. 12/2016 ABI wnl; b. 12/2016 Venous doppler: venous incompetence in R saphenous femoral jxn and the great saphenous vein. Venous incompetence on L. No DVT.  ? ? ?Past Surgical History:  ?Procedure Laterality Date  ? CARPAL TUNNEL RELEASE Right 01/18/2016  ? Procedure: CARPAL TUNNEL RELEASE AND GUYUNS CANAL RIGHT HAND;  Surgeon: Kennedy Bucker, MD;  Location: [facility] ORS;  Service: Orthopedics;  Laterality: Right;  ? JOINT REPLACEMENT Left 2003  ? PARTIAL KNEE  ? MUSCLE BIOPSY    ? ? ?Current  Medications: ?Current Meds  ?Medication Sig  ? aspirin EC 81 MG tablet Take 1 tablet (81 mg total) by mouth daily.  ? atorvastatin (LIPITOR) 40 MG tablet Take 1 tablet (40 mg total) by mouth daily.  ? busPIRone (BUSPAR) 10 MG tablet Take 10 mg by mouth 2 (two) times daily.  ? CRANBERRY PO Take 1 tablet by mouth as needed.   ? docusate sodium (COLACE) 100 MG capsule Take 100 mg by mouth as needed for mild constipation.  ? DULoxetine (CYMBALTA) 30 MG capsule Take 60 mg by mouth daily.  ? gabapentin (NEURONTIN) 100 MG capsule Take 100 mg by mouth 3 (three) times daily.  ? Homeopathic Products (ALLERGY MEDICINE PO) Take 1 tablet by mouth daily.  ? LORazepam (ATIVAN) 1 MG tablet TK 1 T PO QHS  ? metFORMIN (GLUCOPHAGE) 500 MG tablet Take 1,000 mg by mouth 2 (two) times daily with a meal.   ? metoprolol tartrate (LOPRESSOR) 50 MG tablet Take 100 mg by mouth 2 (two) times daily.  ? ramelteon (ROZEREM) 8 MG tablet Take 8 mg by mouth at bedtime.  ?  ? ?Allergies:   Azithromycin and Erythromycin  ? ?Social History  ? ?Socioeconomic History  ? Marital status: Divorced  ?  Spouse name: Not on file  ? Number of children: Not on file  ? Years of education: Not on file  ? Highest education level: Not on file  ?Occupational History  ? Not on file  ?Tobacco Use  ? Smoking status: Some Days  ?  Packs/day: 0.75  ?  Years: 52.00  ?  Pack years: 39.00  ?  Types: Cigarettes  ? Smokeless tobacco: Never  ? Tobacco comments:  ?  1-2 CIGARETTES EVERY OTHER DAY  ?Vaping Use  ? Vaping Use: Never used  ?Substance and Sexual Activity  ? Alcohol use: No  ?  Comment: RARE  ? Drug use: No  ? Sexual activity: Never  ?Other Topics Concern  ? Not on file  ?Social History Narrative  ? Not on file  ? ?Social Determinants of Health  ? ?Financial Resource Strain: Not on file  ?Food Insecurity: Not on file  ?Transportation Needs: Not on file  ?Physical Activity: Not on file  ?Stress: Not on file  ?Social Connections: Not on file  ?  ? ?Family History: ?The  patient's family history includes Heart disease in her father. ? ?ROS:   ?Please see the history of present illness.    All other systems reviewed and are negative. ? ?EKGs/Labs/Other Studies Reviewed:   ? ?The following studies were reviewed today: ? ?Echo 06/2021 ? 1. Left ventricular ejection fraction, by estimation, is 60 to 65%. The  ?left ventricle has normal function. The left ventricle has no regional  ?wall motion abnormalities. There is mild left ventricular hypertrophy.  ?Left ventricular diastolic parameters  ?are consistent with Grade I diastolic dysfunction (impaired relaxation).  ? 2. Right ventricular systolic function is normal. The right ventricular  ?  size is normal. Mildly increased right ventricular wall thickness.  ?Tricuspid regurgitation signal is inadequate for assessing PA pressure.  ? 3. The mitral valve is normal in structure. Mild mitral valve  ?regurgitation. No evidence of mitral stenosis.  ? 4. The aortic valve was not well visualized. Aortic valve regurgitation  ?is mild. No aortic stenosis is present.  ? 5. The inferior vena cava is normal in size with greater than 50%  ?respiratory variability, suggesting right atrial pressure of 3 mmHg. ? ?Heart monitor 06/2021 ?Study Highlights ? ?Patch Wear Time:  13 days and 7 hours (2023-02-24T05:13:44-0500 to 2023-03-09T12:45:34-0500) ?  ?Patient had a min HR of 68 bpm, max HR of 119 bpm, and avg HR of 82 bpm.  ?Predominant underlying rhythm was Sinus Rhythm. Bundle Branch Block/IVCD was present.  ?1 run of Supraventricular Tachycardia occurred lasting 4 beats with a max rate of 119 bpm (avg 117 bpm). ?Rare PACs and rare PVCs. ? ?EKG:  EKG is  ordered today.  The ekg ordered today demonstrates NSR, new RBBB, 61bpm, no other ischemic changes ? ?Recent Labs: ?No results found for requested labs within last 8760 hours.  ?Recent Lipid Panel ?No results found for: CHOL, TRIG, HDL, CHOLHDL, VLDL, LDLCALC, LDLDIRECT ? ? ?Physical Exam:   ? ?VS:  BP  120/80 (BP Location: Left Arm, Patient Position: Sitting, Cuff Size: Normal)   Pulse 76   Ht 5\' 6"  (1.676 m)   Wt 180 lb (81.6 kg)   SpO2 96%   BMI 29.05 kg/m?    ? ?Wt Readings from Last 3 Encounters:  ?08/10/21 18

## 2021-08-10 NOTE — Patient Instructions (Signed)

## 2021-10-24 ENCOUNTER — Emergency Department: Payer: PPO

## 2021-10-24 ENCOUNTER — Encounter: Payer: Self-pay | Admitting: Intensive Care

## 2021-10-24 ENCOUNTER — Inpatient Hospital Stay
Admission: EM | Admit: 2021-10-24 | Discharge: 2021-10-26 | DRG: 871 | Disposition: A | Discharge status: Home / Self Care | Payer: PPO | Attending: Internal Medicine | Admitting: Internal Medicine

## 2021-10-24 ENCOUNTER — Other Ambulatory Visit: Payer: Self-pay

## 2021-10-24 DIAGNOSIS — A419 Sepsis, unspecified organism: Principal | ICD-10-CM | POA: Diagnosis present

## 2021-10-24 DIAGNOSIS — F17210 Nicotine dependence, cigarettes, uncomplicated: Secondary | ICD-10-CM | POA: Diagnosis present

## 2021-10-24 DIAGNOSIS — F418 Other specified anxiety disorders: Secondary | ICD-10-CM | POA: Diagnosis not present

## 2021-10-24 DIAGNOSIS — Z20822 Contact with and (suspected) exposure to covid-19: Secondary | ICD-10-CM | POA: Diagnosis present

## 2021-10-24 DIAGNOSIS — Z96652 Presence of left artificial knee joint: Secondary | ICD-10-CM | POA: Diagnosis present

## 2021-10-24 DIAGNOSIS — I2510 Atherosclerotic heart disease of native coronary artery without angina pectoris: Secondary | ICD-10-CM | POA: Diagnosis present

## 2021-10-24 DIAGNOSIS — E872 Acidosis, unspecified: Secondary | ICD-10-CM | POA: Diagnosis present

## 2021-10-24 DIAGNOSIS — N179 Acute kidney failure, unspecified: Secondary | ICD-10-CM | POA: Diagnosis present

## 2021-10-24 DIAGNOSIS — I5032 Chronic diastolic (congestive) heart failure: Secondary | ICD-10-CM | POA: Diagnosis present

## 2021-10-24 DIAGNOSIS — J189 Pneumonia, unspecified organism: Secondary | ICD-10-CM | POA: Diagnosis present

## 2021-10-24 DIAGNOSIS — Z7984 Long term (current) use of oral hypoglycemic drugs: Secondary | ICD-10-CM | POA: Diagnosis not present

## 2021-10-24 DIAGNOSIS — I872 Venous insufficiency (chronic) (peripheral): Secondary | ICD-10-CM | POA: Diagnosis present

## 2021-10-24 DIAGNOSIS — Z8249 Family history of ischemic heart disease and other diseases of the circulatory system: Secondary | ICD-10-CM | POA: Diagnosis not present

## 2021-10-24 DIAGNOSIS — F32A Depression, unspecified: Secondary | ICD-10-CM | POA: Diagnosis present

## 2021-10-24 DIAGNOSIS — F419 Anxiety disorder, unspecified: Secondary | ICD-10-CM | POA: Diagnosis present

## 2021-10-24 DIAGNOSIS — Z720 Tobacco use: Secondary | ICD-10-CM | POA: Diagnosis not present

## 2021-10-24 DIAGNOSIS — Z7982 Long term (current) use of aspirin: Secondary | ICD-10-CM

## 2021-10-24 DIAGNOSIS — R6520 Severe sepsis without septic shock: Secondary | ICD-10-CM | POA: Diagnosis present

## 2021-10-24 DIAGNOSIS — E785 Hyperlipidemia, unspecified: Secondary | ICD-10-CM | POA: Diagnosis present

## 2021-10-24 DIAGNOSIS — E119 Type 2 diabetes mellitus without complications: Secondary | ICD-10-CM | POA: Diagnosis present

## 2021-10-24 DIAGNOSIS — Z79899 Other long term (current) drug therapy: Secondary | ICD-10-CM | POA: Diagnosis not present

## 2021-10-24 DIAGNOSIS — J439 Emphysema, unspecified: Secondary | ICD-10-CM | POA: Diagnosis present

## 2021-10-24 DIAGNOSIS — R079 Chest pain, unspecified: Secondary | ICD-10-CM

## 2021-10-24 DIAGNOSIS — R197 Diarrhea, unspecified: Secondary | ICD-10-CM | POA: Diagnosis present

## 2021-10-24 LAB — URINALYSIS, COMPLETE (UACMP) WITH MICROSCOPIC
Bilirubin Urine: NEGATIVE
Glucose, UA: NEGATIVE mg/dL
Hgb urine dipstick: NEGATIVE
Ketones, ur: NEGATIVE mg/dL
Leukocytes,Ua: NEGATIVE
Nitrite: NEGATIVE
Protein, ur: NEGATIVE mg/dL
Specific Gravity, Urine: 1.013 (ref 1.005–1.030)
pH: 5 (ref 5.0–8.0)

## 2021-10-24 LAB — SARS CORONAVIRUS 2 BY RT PCR: SARS Coronavirus 2 by RT PCR: NEGATIVE

## 2021-10-24 LAB — CBC
HCT: 38.4 % (ref 36.0–46.0)
Hemoglobin: 11.9 g/dL — ABNORMAL LOW (ref 12.0–15.0)
MCH: 29.7 pg (ref 26.0–34.0)
MCHC: 31 g/dL (ref 30.0–36.0)
MCV: 95.8 fL (ref 80.0–100.0)
Platelets: 327 10*3/uL (ref 150–400)
RBC: 4.01 MIL/uL (ref 3.87–5.11)
RDW: 14 % (ref 11.5–15.5)
WBC: 18.1 10*3/uL — ABNORMAL HIGH (ref 4.0–10.5)
nRBC: 0 % (ref 0.0–0.2)

## 2021-10-24 LAB — TROPONIN I (HIGH SENSITIVITY)
Troponin I (High Sensitivity): 7 ng/L (ref <18)
Troponin I (High Sensitivity): 5 ng/L (ref <18)

## 2021-10-24 LAB — BASIC METABOLIC PANEL
Anion gap: 14 (ref 5–15)
BUN: 17 mg/dL (ref 8–23)
CO2: 23 mmol/L (ref 22–32)
Calcium: 9.2 mg/dL (ref 8.9–10.3)
Chloride: 102 mmol/L (ref 98–111)
Creatinine, Ser: 1.33 mg/dL — ABNORMAL HIGH (ref 0.44–1.00)
GFR, Estimated: 42 mL/min — ABNORMAL LOW (ref >60)
Glucose, Bld: 161 mg/dL — ABNORMAL HIGH (ref 70–99)
Potassium: 3.5 mmol/L (ref 3.5–5.1)
Sodium: 139 mmol/L (ref 135–145)

## 2021-10-24 LAB — LACTIC ACID, PLASMA
Lactic Acid, Venous: 4.9 mmol/L (ref 0.5–1.9)
Lactic Acid, Venous: 2.9 mmol/L (ref 0.5–1.9)

## 2021-10-24 LAB — PROCALCITONIN: Procalcitonin: 0.62 ng/mL

## 2021-10-24 LAB — BRAIN NATRIURETIC PEPTIDE: B Natriuretic Peptide: 52.9 pg/mL (ref 0.0–100.0)

## 2021-10-24 MED ORDER — SODIUM CHLORIDE 0.9 % IV BOLUS
1000.0000 mL | Freq: Once | INTRAVENOUS | Status: AC
  Administered 2021-10-24: 1000 mL via INTRAVENOUS

## 2021-10-24 MED ORDER — ENOXAPARIN SODIUM 40 MG/0.4ML IJ SOSY
40.0000 mg | PREFILLED_SYRINGE | INTRAMUSCULAR | Status: DC
  Administered 2021-10-24 – 2021-10-25 (×2): 40 mg via SUBCUTANEOUS
  Filled 2021-10-24 (×2): qty 0.4

## 2021-10-24 MED ORDER — NICOTINE 21 MG/24HR TD PT24
21.0000 mg | MEDICATED_PATCH | Freq: Every day | TRANSDERMAL | Status: DC
  Filled 2021-10-24 (×2): qty 1

## 2021-10-24 MED ORDER — LORAZEPAM 0.5 MG PO TABS
0.5000 mg | ORAL_TABLET | Freq: Two times a day (BID) | ORAL | Status: DC | PRN
  Administered 2021-10-25 – 2021-10-26 (×2): 0.5 mg via ORAL
  Filled 2021-10-24 (×2): qty 1

## 2021-10-24 MED ORDER — BUSPIRONE HCL 10 MG PO TABS
10.0000 mg | ORAL_TABLET | Freq: Two times a day (BID) | ORAL | Status: DC
  Administered 2021-10-24 – 2021-10-26 (×4): 10 mg via ORAL
  Filled 2021-10-24 (×4): qty 1

## 2021-10-24 MED ORDER — ASPIRIN 81 MG PO TBEC
81.0000 mg | DELAYED_RELEASE_TABLET | Freq: Every day | ORAL | Status: DC
  Administered 2021-10-25 – 2021-10-26 (×2): 81 mg via ORAL
  Filled 2021-10-24 (×2): qty 1

## 2021-10-24 MED ORDER — ACETAMINOPHEN 325 MG PO TABS: 650.0000 mg | ORAL_TABLET | Freq: Four times a day (QID) | ORAL | Status: DC | PRN

## 2021-10-24 MED ORDER — LACTATED RINGERS IV BOLUS
1000.0000 mL | Freq: Once | INTRAVENOUS | Status: AC
  Administered 2021-10-24: 1000 mL via INTRAVENOUS

## 2021-10-24 MED ORDER — SODIUM CHLORIDE 0.9 % IV SOLN
1.0000 g | INTRAVENOUS | Status: DC
  Administered 2021-10-25: 1 g via INTRAVENOUS
  Filled 2021-10-24: qty 1

## 2021-10-24 MED ORDER — SODIUM CHLORIDE 0.9 % IV SOLN
500.0000 mg | INTRAVENOUS | Status: DC
  Administered 2021-10-25: 500 mg via INTRAVENOUS
  Filled 2021-10-24: qty 500

## 2021-10-24 MED ORDER — DULOXETINE HCL 30 MG PO CPEP
60.0000 mg | ORAL_CAPSULE | Freq: Every day | ORAL | Status: DC
  Administered 2021-10-25 – 2021-10-26 (×2): 60 mg via ORAL
  Filled 2021-10-24 (×2): qty 2

## 2021-10-24 MED ORDER — ONDANSETRON HCL 4 MG/2ML IJ SOLN: 4.0000 mg | Freq: Three times a day (TID) | INTRAMUSCULAR | Status: DC | PRN

## 2021-10-24 MED ORDER — INSULIN ASPART 100 UNIT/ML IJ SOLN: 0.0000 [IU] | Freq: Every day | INTRAMUSCULAR | Status: DC

## 2021-10-24 MED ORDER — DM-GUAIFENESIN ER 30-600 MG PO TB12
1.0000 | ORAL_TABLET | Freq: Two times a day (BID) | ORAL | Status: DC | PRN
  Administered 2021-10-24: 1 via ORAL
  Filled 2021-10-24: qty 1

## 2021-10-24 MED ORDER — ALBUTEROL SULFATE (2.5 MG/3ML) 0.083% IN NEBU: 3.0000 mL | INHALATION_SOLUTION | RESPIRATORY_TRACT | Status: DC | PRN

## 2021-10-24 MED ORDER — SODIUM CHLORIDE 0.9 % IV SOLN
500.0000 mg | Freq: Once | INTRAVENOUS | Status: AC
  Administered 2021-10-24: 500 mg via INTRAVENOUS
  Filled 2021-10-24: qty 5

## 2021-10-24 MED ORDER — SODIUM CHLORIDE 0.9 % IV SOLN
1.0000 g | Freq: Once | INTRAVENOUS | Status: AC
  Administered 2021-10-24: 1 g via INTRAVENOUS
  Filled 2021-10-24: qty 10

## 2021-10-24 MED ORDER — GABAPENTIN 100 MG PO CAPS
100.0000 mg | ORAL_CAPSULE | Freq: Three times a day (TID) | ORAL | Status: DC
  Administered 2021-10-24 – 2021-10-26 (×5): 100 mg via ORAL
  Filled 2021-10-24 (×5): qty 1

## 2021-10-24 MED ORDER — INSULIN ASPART 100 UNIT/ML IJ SOLN
0.0000 [IU] | Freq: Three times a day (TID) | INTRAMUSCULAR | Status: DC
  Administered 2021-10-25 – 2021-10-26 (×4): 2 [IU] via SUBCUTANEOUS
  Filled 2021-10-24 (×4): qty 1

## 2021-10-24 MED ORDER — HYDRALAZINE HCL 20 MG/ML IJ SOLN
5.0000 mg | INTRAMUSCULAR | Status: DC | PRN
Start: 2021-10-24 — End: 2021-10-26

## 2021-10-24 MED ORDER — ATORVASTATIN CALCIUM 20 MG PO TABS
40.0000 mg | ORAL_TABLET | Freq: Every day | ORAL | Status: DC
  Administered 2021-10-25 – 2021-10-26 (×2): 40 mg via ORAL
  Filled 2021-10-24 (×2): qty 2

## 2021-10-24 NOTE — Plan of Care (Signed)
  Problem: Clinical Measurements: Goal: Ability to maintain a body temperature in the normal range will improve Outcome: Progressing   Problem: Respiratory: Goal: Ability to maintain adequate ventilation will improve Outcome: Progressing Goal: Ability to maintain a clear airway will improve Outcome: Progressing   

## 2021-10-24 NOTE — Assessment & Plan Note (Signed)
2D echo on 07/27/2021 showed EF of 60 to 65% with grade 1 diastolic dysfunction.  Patient does not have leg edema.  BNP 52.9.  CHF is compensated. -Watch volume status closely

## 2021-10-24 NOTE — ED Provider Notes (Signed)
[facility] Provider Note    Event Date/Time   First MD Initiated Contact with Patient 10/24/21 1625     (approximate)   History   Chief Complaint Chest Pain   HPI  Dana [name] is a 76 y.o. female with past medical history of hyperlipidemia, diabetes, and pulmonary hypertension who presents to the ED complaining of chest pain.  Patient reports that for about the past 5 days she has been dealing with a cough productive of whitish sputum, describes a heaviness like something sitting on her chest whenever she has a coughing spell.  She denies any chest pain between episodes of coughing, but does state that she has been feeling more short of breath than usual.  She has not had any fevers but describes generalized malaise with headache and bilateral ear pain.  She is not aware of any sick contacts.  She has not noticed any pain or swelling in her legs, denies significant cardiac history.     Physical Exam   Triage Vital Signs: ED Triage Vitals [10/24/21 1526]  Enc Vitals Group     BP (!) 96/54     Pulse Rate 83     Resp 20     Temp 97.8 F (36.6 C)     Temp Source Oral     SpO2 96 %     Weight 180 lb (81.6 kg)     Height 5\' 6"  (1.676 m)     Head Circumference      Peak Flow      Pain Score 6     Pain Loc      Pain Edu?      Excl. in GC?     Most recent vital signs: Vitals:   10/24/21 1526 10/24/21 1625  BP: (!) 96/54 107/72  Pulse: 83   Resp: 20 (!) 24  Temp: 97.8 F (36.6 C)   SpO2: 96%     Constitutional: Alert and oriented. Eyes: Conjunctivae are normal. Head: Atraumatic. Nose: No congestion/rhinnorhea. Mouth/Throat: Mucous membranes are moist.  Ears: TMs clear bilaterally. Cardiovascular: Normal rate, regular rhythm. Grossly normal heart sounds.  2+ radial pulses bilaterally. Respiratory: Normal respiratory effort.  No retractions. Lungs CTAB. Gastrointestinal: Soft and nontender. No distention. Musculoskeletal: No lower  extremity tenderness nor edema.  Neurologic:  Normal speech and language. No gross focal neurologic deficits are appreciated.    ED Results / Procedures / Treatments   Labs (all labs ordered are listed, but only abnormal results are displayed) Labs Reviewed  BASIC METABOLIC PANEL - Abnormal; Notable for the following components:      Result Value   Glucose, Bld 161 (*)    Creatinine, Ser 1.33 (*)    GFR, Estimated 42 (*)    All other components within normal limits  CBC - Abnormal; Notable for the following components:   WBC 18.1 (*)    Hemoglobin 11.9 (*)    All other components within normal limits  CULTURE, BLOOD (ROUTINE X 2)  CULTURE, BLOOD (ROUTINE X 2)  SARS CORONAVIRUS 2 BY RT PCR  LACTIC ACID, PLASMA  LACTIC ACID, PLASMA  TROPONIN I (HIGH SENSITIVITY)     EKG  ED ECG REPORT I, 10/26/21, the attending physician, personally viewed and interpreted this ECG.   Date: 10/24/2021  EKG Time: 15:36  Rate: 83  Rhythm: normal sinus rhythm  Axis: Normal  Intervals:right bundle branch block  ST&T Change: Inferolateral T wave inversions  RADIOLOGY Chest x-ray reviewed and  interpreted by me with right upper lobe infiltrate concerning for pneumonia.  PROCEDURES:  Critical Care performed: Yes, see critical care procedure note(s)  .Critical Care  Performed by: Chesley Noon, MD Authorized by: Chesley Noon, MD   Critical care provider statement:    Critical care time (minutes):  30   Critical care time was exclusive of:  Separately billable procedures and treating other patients and teaching time   Critical care was necessary to treat or prevent imminent or life-threatening deterioration of the following conditions:  Sepsis   Critical care was time spent personally by me on the following activities:  Development of treatment plan with patient or surrogate, discussions with consultants, evaluation of patient's response to treatment, examination of patient,  ordering and review of laboratory studies, ordering and review of radiographic studies, ordering and performing treatments and interventions, pulse oximetry, re-evaluation of patient's condition and review of old charts   I assumed direction of critical care for this patient from another provider in my specialty: no     Care discussed with: admitting provider      MEDICATIONS ORDERED IN ED: Medications  cefTRIAXone (ROCEPHIN) 1 g in sodium chloride 0.9 % 100 mL IVPB (has no administration in time range)  azithromycin (ZITHROMAX) 500 mg in sodium chloride 0.9 % 250 mL IVPB (has no administration in time range)  lactated ringers bolus 1,000 mL (has no administration in time range)     IMPRESSION / MDM / ASSESSMENT AND PLAN / ED COURSE  I reviewed the triage vital signs and the nursing notes.                              76 y.o. female with past medical history of hyperlipidemia, diabetes, and pulmonary hypertension who presents to the ED complaining of 5 days of productive cough associated with chest heaviness and difficulty breathing.  Patient's presentation is most consistent with acute presentation with potential threat to life or bodily function.  Differential diagnosis includes, but is not limited to, ACS, PE, pneumonia, pneumothorax, sepsis, dissection, GERD, musculoskeletal chest pain, anxiety, electrolyte abnormality, AKI.  Patient nontoxic-appearing and in no acute distress, vital signs remarkable for borderline low blood pressure along with mild tachypnea.  Initial labs remarkable for leukocytosis and chest x-ray shows right upper lobe infiltrate concerning for pneumonia.  Patient meets sepsis criteria based off of respiratory rate and leukocytosis, we will treat apparent community-acquired pneumonia with Rocephin and azithromycin.  EKG shows T wave inversions inferolaterally however troponin is within normal limits and her chest discomfort is likely secondary to pneumonia.  We will  continue to trend troponin.  Additional labs remarkable for AKI without significant electrolyte abnormality, plan to hydrate with IV fluids, MAP remains greater than 65 at this time.  Case discussed with hospitalist for admission.      FINAL CLINICAL IMPRESSION(S) / ED DIAGNOSES   Final diagnoses:  Sepsis without acute organ dysfunction, due to unspecified organism [facility])  Community acquired pneumonia of right upper lobe of lung  Nonspecific chest pain     Rx / DC Orders   ED Discharge Orders     None        Note:  This document was prepared using Dragon voice recognition software and may include unintentional dictation errors.   Chesley Noon, MD 10/24/21 [phone]

## 2021-10-24 NOTE — Progress Notes (Signed)
CODE SEPSIS - PHARMACY COMMUNICATION  **Broad Spectrum Antibiotics should be administered within 1 hour of Sepsis diagnosis**  Time Code Sepsis Called/Page Received: 1647  Antibiotics Ordered: Ceftriaxone + azithromycin  Time of 1st antibiotic administration: 1702   Additional action taken by pharmacy: N/A  Tressie Ellis 10/24/2021  4:56 PM

## 2021-10-24 NOTE — Assessment & Plan Note (Signed)
-   Continue home medications 

## 2021-10-24 NOTE — Sepsis Progress Note (Signed)
Elink is monitoring code sepsis 

## 2021-10-24 NOTE — ED Triage Notes (Signed)
Pt c/o heaviness on chest, diarrhea, headache, earaches, weakness and cough for several days

## 2021-10-24 NOTE — Assessment & Plan Note (Signed)
Patient is taking metformin.  Recent A1c 8.0, poorly controlled. - Sliding scale insulin

## 2021-10-24 NOTE — Assessment & Plan Note (Signed)
Creatinine 1.33, BUN 17, GFR 42.  Patient has a baseline creatinine 0.81, 12/05/2017. -Avoid using renal toxic medications -Follow-up urinalysis -IV fluid as above

## 2021-10-24 NOTE — Assessment & Plan Note (Signed)
-  see above 

## 2021-10-24 NOTE — Assessment & Plan Note (Signed)
Pt has chest pain which is most likely due to pneumonia.  Initial troponin is 7. -Continue aspirin, Lipitor

## 2021-10-24 NOTE — Assessment & Plan Note (Signed)
-   Check C. difficile PCR and GI pathogen panel

## 2021-10-24 NOTE — ED Notes (Signed)
Pt reports chest heaviness, diarrhea, weakness for 5-6 days.  Pt was sent from [facility] for an eval   pt reports intermittent sob.  Cig smoker. Pt has a cough.  No fever.  No abd pain   diarrhea x 2today.  No vomiting.  Pt alert   speech clear.

## 2021-10-24 NOTE — ED Notes (Signed)
Pt eating dinner tray °

## 2021-10-24 NOTE — Assessment & Plan Note (Signed)
Sepsis due to CAP: pt meets criteria for sepsis with WBC 18.1 and RR 24.  - Admit to tele bed as inpt - IV Rocephin and azithromycin (pt has hx of allergy to oral azithromycin, but she tolerated IV azithromycin) .  - Mucinex for cough  - Bronchodilators - Urine legionella and S. pneumococcal antigen - Follow up blood culture x2, sputum culture  - will get Procalcitonin and trend lactic acid level per sepsis protocol - IVF: total of 2L of IVF bolus

## 2021-10-24 NOTE — H&P (Signed)
History and Physical    Dana [name] HUT:[id number] DOB: Apr 06, 1946 DOA: 10/24/2021  Referring MD/NP/PA:   PCP: Jaclyn Shaggy, MD   Patient coming from:  The patient is coming from home.  At baseline, pt is independent for most of ADL.        Chief Complaint: cough, chest pain  HPI: Dana [name] is a 76 y.o. female with medical history significant of dCHF, hyperlipidemia, diabetes mellitus, depression with anxiety, tobacco abuse, PAH,, venous insufficiency, who presents with cough and chest pain.  Patient states that for has been sick for almost 5 days.  She has chest pain and productive cough with white mucus production.  Patient has headache and generalized weakness. Her chest pain is located in front of chest, constant, 7 out of 10 in severity, pressure, aggravated by coughing, nonradiating.  Patient does not have fever or chills.  Patient denies nausea vomiting or abdominal pain.  Patient has had diarrhea in the past 2 days, with several watery diarrhea each day.  No symptoms of UTI.  Data Reviewed and ED Course: pt was found to have WBC 18.1, troponin level 7, pending COVID PCR, AKI with creatinine 1.33, BUN 17, GFR 42 (recent baseline creatinine 0.81 on 12/05/2017).  Temperature normal.  Blood pressure soft at 96/54, heart rate 83, RR 24, oxygen saturation 96% on room air.  Chest x-ray showed right upper lobe infiltration.  Patient is admitted to telemetry bed as inpatient.   EKG: I have personally reviewed.  Sinus rhythm, QTc 486, right bundle blockade, low voltage, early R wave progression, T wave inversion diffusely.   Review of Systems:   General: no fevers, chills, no body weight gain, has fatigue HEENT: no blurry vision, hearing changes or sore throat Respiratory: has dyspnea, coughing, no wheezing CV: has chest pain, no palpitations GI: no nausea, vomiting, abdominal pain, has diarrhea, no constipation GU: no dysuria, burning on urination, increased urinary  frequency, hematuria  Ext: no leg edema Neuro: no unilateral weakness, numbness, or tingling, no vision change or hearing loss Skin: no rash, no skin tear. MSK: No muscle spasm, no deformity, no limitation of range of movement in spin Heme: No easy bruising.  Travel history: No recent long distant travel.   Allergy:  Allergies  Allergen Reactions   Azithromycin Other (See Comments)    N/V N/V N/V   Erythromycin Nausea And Vomiting and Other (See Comments)    Past Medical History:  Diagnosis Date   Coronary artery calcification seen on CT scan    a. 06/2015 CTA Chest: coronary and Ao Ca2+ noted.   Diabetes mellitus without complication (HCC)    Diastolic dysfunction    a. 12/2016 Echo: Gr1 DD.   Dyspnea on exertion    a. 05/2015 Stress Echo: normal wall motion, nl LV fxn;  b. 12/2016 Echo: EF 60-65%, small cavity szie w/ near cavity obliteration in systole.  LVOT . No change w/ valsalva. Mild conc LVH, Gr1 DD, mild MR, nl RV fxn, PASP .   Fall at home    DUE TO [facility] W/U NEGATIVE IN MARCH 2017 WITH [facility]   Hyperlipidemia    PAH (pulmonary artery hypertension) (HCC)    a. 12/2016 Echo: PASP .   Sinus tachycardia    a. 06/2015 Holter: Sinus tach. No significant arrhythmias.   Tobacco abuse    Venous insufficiency    a. 12/2016 ABI wnl; b. 12/2016 Venous doppler: venous incompetence in R saphenous femoral jxn and the great  saphenous vein. Venous incompetence on L. No DVT.    Past Surgical History:  Procedure Laterality Date   CARPAL TUNNEL RELEASE Right 01/18/2016   Procedure: CARPAL TUNNEL RELEASE AND GUYUNS CANAL RIGHT HAND;  Surgeon: Kennedy Bucker, MD;  Location: [facility] ORS;  Service: Orthopedics;  Laterality: Right;   JOINT REPLACEMENT Left 2003   PARTIAL KNEE   MUSCLE BIOPSY      Social History:  reports that she has been smoking cigarettes. She has a 39.00 pack-year smoking history. She has never used smokeless tobacco. She reports that she  does not drink alcohol and does not use drugs.  Family History:  Family History  Problem Relation Age of Onset   Heart disease Father      Prior to Admission medications   Medication Sig Start Date End Date Taking? Authorizing Provider  aspirin EC 81 MG tablet Take 1 tablet (81 mg total) by mouth daily. 09/25/17   Dunn, Raymon Mutton, PA-C  atorvastatin (LIPITOR) 40 MG tablet Take 1 tablet (40 mg total) by mouth daily. 06/15/21   Sharlene Dory, PA-C  busPIRone (BUSPAR) 10 MG tablet Take 10 mg by mouth 2 (two) times daily. 07/17/20   [provider]  CRANBERRY PO Take 1 tablet by mouth as needed.     [provider]  docusate sodium (COLACE) 100 MG capsule Take 100 mg by mouth as needed for mild constipation.    [provider]  DULoxetine (CYMBALTA) 30 MG capsule Take 60 mg by mouth daily. 12/05/16   [provider]  gabapentin (NEURONTIN) 100 MG capsule Take 100 mg by mouth 3 (three) times daily. 07/25/21   [provider]  Homeopathic Products (ALLERGY MEDICINE PO) Take 1 tablet by mouth daily.    [provider]  LORazepam (ATIVAN) 1 MG tablet TK 1 T PO QHS 12/05/17   [provider]  metFORMIN (GLUCOPHAGE) 500 MG tablet Take 1,000 mg by mouth 2 (two) times daily with a meal.     [provider]  metoprolol tartrate (LOPRESSOR) 50 MG tablet Take 100 mg by mouth 2 (two) times daily.    [provider]  ramelteon (ROZEREM) 8 MG tablet Take 8 mg by mouth at bedtime. 06/26/21   [provider]    Physical Exam: Vitals:   10/24/21 1526 10/24/21 1625 10/24/21 1800 10/24/21 1834  BP: (!) 96/54 107/72 111/60 (!) 105/51  Pulse: 83  84 84  Resp: 20 (!) 24 15 18   Temp: 97.8 F (36.6 C)   97.7 F (36.5 C)  TempSrc: Oral   Oral  SpO2: 96%  100% 97%  Weight: 81.6 kg     Height: 5\' 6"  (1.676 m)      General: Not in acute distress HEENT:       Eyes: PERRL, EOMI, no scleral icterus.       ENT: No discharge from the  ears and nose, no pharynx injection, no tonsillar enlargement.        Neck: No JVD, no bruit, no mass felt. Heme: No neck lymph node enlargement. Cardiac: S1/S2, RRR, No murmurs, No gallops or rubs. Respiratory: No rales, wheezing, rhonchi or rubs. GI: Soft, nondistended, nontender, no rebound pain, no organomegaly, BS present. GU: No hematuria Ext: No pitting leg edema bilaterally. 1+DP/PT pulse bilaterally. Musculoskeletal: No joint deformities, No joint redness or warmth, no limitation of ROM in spin. Skin: No rashes.  Neuro: Alert, oriented X3, cranial nerves II-XII grossly intact, moves all extremities normally. Psych:  Patient is not psychotic, no suicidal or hemocidal ideation.  Labs on Admission: I have personally reviewed following labs and imaging studies  CBC: Recent Labs  Lab 10/24/21 1536  WBC 18.1*  HGB 11.9*  HCT 38.4  MCV 95.8  PLT 327   Basic Metabolic Panel: Recent Labs  Lab 10/24/21 1536  NA 139  K 3.5  CL 102  CO2 23  GLUCOSE 161*  BUN 17  CREATININE 1.33*  CALCIUM 9.2   GFR: Estimated Creatinine Clearance: 39.3 mL/min (A) (by C-G formula based on SCr of 1.33 mg/dL (H)). Liver Function Tests: No results for input(s): "AST", "ALT", "ALKPHOS", "BILITOT", "PROT", "ALBUMIN" in the last 168 hours. No results for input(s): "LIPASE", "AMYLASE" in the last 168 hours. No results for input(s): "AMMONIA" in the last 168 hours. Coagulation Profile: No results for input(s): "INR", "PROTIME" in the last 168 hours. Cardiac Enzymes: No results for input(s): "CKTOTAL", "CKMB", "CKMBINDEX", "TROPONINI" in the last 168 hours. BNP (last 3 results) No results for input(s): "PROBNP" in the last 8760 hours. HbA1C: No results for input(s): "HGBA1C" in the last 72 hours. CBG: No results for input(s): "GLUCAP" in the last 168 hours. Lipid Profile: No results for input(s): "CHOL", "HDL", "LDLCALC", "TRIG", "CHOLHDL", "LDLDIRECT" in the last 72 hours. Thyroid Function  Tests: No results for input(s): "TSH", "T4TOTAL", "FREET4", "T3FREE", "THYROIDAB" in the last 72 hours. Anemia Panel: No results for input(s): "VITAMINB12", "FOLATE", "FERRITIN", "TIBC", "IRON", "RETICCTPCT" in the last 72 hours. Urine analysis:    Component Value Date/Time   COLORURINE YELLOW (A) 07/10/2015 1755   APPEARANCEUR CLEAR (A) 07/10/2015 1755   APPEARANCEUR Clear 05/16/2014 0903   LABSPEC 1.029 07/10/2015 1755   LABSPEC 1.011 05/16/2014 0903   PHURINE 5.0 07/10/2015 1755   GLUCOSEU NEGATIVE 07/10/2015 1755   GLUCOSEU Negative 05/16/2014 0903   HGBUR NEGATIVE 07/10/2015 1755   BILIRUBINUR NEGATIVE 07/10/2015 1755   BILIRUBINUR Negative 05/16/2014 0903   KETONESUR NEGATIVE 07/10/2015 1755   PROTEINUR NEGATIVE 07/10/2015 1755   NITRITE NEGATIVE 07/10/2015 1755   LEUKOCYTESUR TRACE (A) 07/10/2015 1755   LEUKOCYTESUR Negative 05/16/2014 0903   Sepsis Labs: @LABRCNTIP (procalcitonin:4,lacticidven:4) ) Recent Results (from the past 240 hour(s))  SARS Coronavirus 2 by RT PCR (hospital order[facility]) *cepheid single result test* Anterior Nasal Swab     Status: None   Collection Time: 10/24/21  4:54 PM   Specimen: Anterior Nasal Swab  Result Value Ref Range Status   SARS Coronavirus 2 by RT PCR NEGATIVE NEGATIVE Final    Comment: (NOTE) SARS-CoV-2 target nucleic acids are NOT DETECTED.  The SARS-CoV-2 RNA is generally detectable in upper and lower respiratory specimens during the acute phase of infection. The lowest concentration of SARS-CoV-2 viral copies this assay can detect is 250 copies / mL. A negative result does not preclude SARS-CoV-2 infection and should not be used as the sole basis for treatment or other patient management decisions.  A negative result may occur with improper specimen collection / handling, submission of specimen other than nasopharyngeal swab, presence of viral mutation(s) within the areas targeted by this assay,  and inadequate number of viral copies (<250 copies / mL). A negative result must be combined with clinical observations, patient history, and epidemiological information.  Fact Sheet for Patients:   RoadLapTop.co.zahttps://www.fda.gov/media/158405/download  Fact Sheet for Healthcare Providers: http://kim-miller.com/https://www.fda.gov/media/158404/download  This test is not yet approved or  cleared by the [place] FDA and has been authorized for detection and/or diagnosis of SARS-CoV-2 by  FDA under an Emergency Use Authorization (EUA).  This EUA will remain in effect (meaning this test can be used) for the duration of the COVID-19 declaration under Section 564(b)(1) of the Act, 21 U.S.C. section 360bbb-3(b)(1), unless the authorization is terminated or revoked sooner.  Performed at [facility], [address], [place], [place] [address]      Radiological Exams on Admission: DG Chest 2 View  Result Date: 10/24/2021 CLINICAL DATA:  Chest pain/heaviness, weakness, and cough. EXAM: CHEST - 2 VIEW COMPARISON:  Chest radiographs 06/02/2014 and CT 09/29/2018 FINDINGS: The cardiomediastinal silhouette is within normal limits. Aortic atherosclerosis is noted. There is an approximately 5 cm region of ill-defined opacity in the right upper lobe. The left lung is clear. No pleural effusion or pneumothorax is identified. Underlying emphysema is noted. No acute osseous abnormality is seen. IMPRESSION: Ill-defined right upper lobe opacity concerning for pneumonia. Followup PA and lateral chest X-ray is recommended in 3-4 weeks following trial of antibiotic therapy to ensure resolution and exclude underlying malignancy. Electronically Signed   By: Sebastian Ache M.D.   On: 10/24/2021 16:05      Assessment/Plan Principal Problem:   CAP (community acquired pneumonia) Active Problems:   Sepsis (HCC)   Chronic diastolic CHF (congestive heart failure) (HCC)   Diabetes mellitus without complication (HCC)   HLD  (hyperlipidemia)   CAD (coronary artery disease)   AKI (acute kidney injury) (HCC)   Diarrhea   Depression with anxiety   Tobacco abuse   Principal Problem:   CAP (community acquired pneumonia) Active Problems:   Sepsis (HCC)   Chronic diastolic CHF (congestive heart failure) (HCC)   Diabetes mellitus without complication (HCC)   HLD (hyperlipidemia)   CAD (coronary artery disease)   AKI (acute kidney injury) (HCC)   Diarrhea   Depression with anxiety   Tobacco abuse   Assessment and Plan: * CAP (community acquired pneumonia) Sepsis due to CAP: pt meets criteria for sepsis with WBC 18.1 and RR 24.  - Admit to tele bed as inpt - IV Rocephin and azithromycin (pt has hx of allergy to oral azithromycin, but she tolerated IV azithromycin) .  - Mucinex for cough  - Bronchodilators - Urine legionella and S. pneumococcal antigen - Follow up blood culture x2, sputum culture  - will get Procalcitonin and trend lactic acid level per sepsis protocol - IVF: total of 2L of IVF bolus   Sepsis (HCC) -see above  Chronic diastolic CHF (congestive heart failure) (HCC) 2D echo on 07/27/2021 showed EF of 60 to 65% with grade 1 diastolic dysfunction.  Patient does not have leg edema.  BNP 52.9.  CHF is compensated. -Watch volume status closely  Diabetes mellitus without complication (HCC) Patient is taking metformin.  Recent A1c 8.0, poorly controlled. - Sliding scale insulin  HLD (hyperlipidemia) -lipitor  CAD (coronary artery disease) Pt has chest pain which is most likely due to pneumonia.  Initial troponin is 7. -Continue aspirin, Lipitor  AKI (acute kidney injury) (HCC) Creatinine 1.33, BUN 17, GFR 42.  Patient has a baseline creatinine 0.81, 12/05/2017. -Avoid using renal toxic medications -Follow-up urinalysis -IV fluid as above  Diarrhea - Check C. difficile PCR and GI pathogen panel  Depression with anxiety - Continue home medications  Tobacco abuse - Nicotine  patch             DVT ppx: SQ Lovenox  Code Status: Partial code (I discussed with the patient in the presence of family, and explained the  meaning of CODE STATUS, patient wants to be partial code, OK for CPR, but no intubation).  Family Communication: I offered to call her family, patient states that I do not need to call her family.  She will call them by herself  Disposition Plan:  Anticipate discharge back to previous environment  Consults called:  none  Admission status and Level of care: Telemetry Medical:     as inpt        Severity of Illness:  The appropriate patient status for this patient is INPATIENT. Inpatient status is judged to be reasonable and necessary in order to provide the required intensity of service to ensure the patient's safety. The patient's presenting symptoms, physical exam findings, and initial radiographic and laboratory data in the context of their chronic comorbidities is felt to place them at high risk for further clinical deterioration. Furthermore, it is not anticipated that the patient will be medically stable for discharge from the hospital within 2 midnights of admission.   * I certify that at the point of admission it is my clinical judgment that the patient will require inpatient hospital care spanning beyond 2 midnights from the point of admission due to high intensity of service, high risk for further deterioration and high frequency of surveillance required.*       Date of Service 10/24/2021    Lorretta Harp [facility]   If 7PM-7AM, please contact night-coverage [email or web address] 10/24/2021, 9:24 PM

## 2021-10-24 NOTE — Assessment & Plan Note (Signed)
-  Nicotine patch 

## 2021-10-24 NOTE — Assessment & Plan Note (Signed)
lipitor

## 2021-10-25 DIAGNOSIS — R6520 Severe sepsis without septic shock: Secondary | ICD-10-CM

## 2021-10-25 DIAGNOSIS — J189 Pneumonia, unspecified organism: Secondary | ICD-10-CM | POA: Diagnosis not present

## 2021-10-25 DIAGNOSIS — N179 Acute kidney failure, unspecified: Secondary | ICD-10-CM | POA: Diagnosis not present

## 2021-10-25 DIAGNOSIS — A419 Sepsis, unspecified organism: Secondary | ICD-10-CM | POA: Diagnosis not present

## 2021-10-25 LAB — CBC
HCT: 35.2 % — ABNORMAL LOW (ref 36.0–46.0)
Hemoglobin: 11.1 g/dL — ABNORMAL LOW (ref 12.0–15.0)
MCH: 29.6 pg (ref 26.0–34.0)
MCHC: 31.5 g/dL (ref 30.0–36.0)
MCV: 93.9 fL (ref 80.0–100.0)
Platelets: 281 10*3/uL (ref 150–400)
RBC: 3.75 MIL/uL — ABNORMAL LOW (ref 3.87–5.11)
RDW: 13.8 % (ref 11.5–15.5)
WBC: 16 10*3/uL — ABNORMAL HIGH (ref 4.0–10.5)
nRBC: 0 % (ref 0.0–0.2)

## 2021-10-25 LAB — GASTROINTESTINAL PANEL BY PCR, STOOL (REPLACES STOOL CULTURE)

## 2021-10-25 LAB — GLUCOSE, CAPILLARY
Glucose-Capillary: 153 mg/dL — ABNORMAL HIGH (ref 70–99)
Glucose-Capillary: 164 mg/dL — ABNORMAL HIGH (ref 70–99)
Glucose-Capillary: 196 mg/dL — ABNORMAL HIGH (ref 70–99)
Glucose-Capillary: 157 mg/dL — ABNORMAL HIGH (ref 70–99)

## 2021-10-25 LAB — BASIC METABOLIC PANEL
Anion gap: 10 (ref 5–15)
BUN: 16 mg/dL (ref 8–23)
CO2: 25 mmol/L (ref 22–32)
Calcium: 8.5 mg/dL — ABNORMAL LOW (ref 8.9–10.3)
Chloride: 105 mmol/L (ref 98–111)
Creatinine, Ser: 0.99 mg/dL (ref 0.44–1.00)
GFR, Estimated: 59 mL/min — ABNORMAL LOW (ref >60)
Glucose, Bld: 227 mg/dL — ABNORMAL HIGH (ref 70–99)
Potassium: 3.6 mmol/L (ref 3.5–5.1)
Sodium: 140 mmol/L (ref 135–145)

## 2021-10-25 LAB — C DIFFICILE QUICK SCREEN W PCR REFLEX
C Diff antigen: NEGATIVE
C Diff interpretation: NOT DETECTED
C Diff toxin: NEGATIVE

## 2021-10-25 LAB — STREP PNEUMONIAE URINARY ANTIGEN: Strep Pneumo Urinary Antigen: NEGATIVE

## 2021-10-25 MED ORDER — LOPERAMIDE HCL 2 MG PO CAPS
2.0000 mg | ORAL_CAPSULE | Freq: Once | ORAL | Status: AC
  Administered 2021-10-25: 2 mg via ORAL
  Filled 2021-10-25: qty 1

## 2021-10-25 MED ORDER — METOPROLOL TARTRATE 50 MG PO TABS
100.0000 mg | ORAL_TABLET | Freq: Two times a day (BID) | ORAL | Status: DC
  Administered 2021-10-25 – 2021-10-26 (×3): 100 mg via ORAL
  Filled 2021-10-25 (×3): qty 2

## 2021-10-25 NOTE — Hospital Course (Addendum)
Dana [name] is a 76 y.o. female with medical history significant of dCHF, hyperlipidemia, diabetes mellitus, depression with anxiety, tobacco abuse, PAH,, venous insufficiency, who presents with cough and chest pain.  Patient had  severe cough spells, her rib hurts after cough.  No significant short of breath.  By time she arrived in the hospital, patient had a leukocytosis, tachypnea, tachycardia.  She also had acute kidney injury with significant elevation of lactic acid at 4.9.  She has a severe sepsis, chest x-ray also showed vague right upper lobe infiltrates.  She is diagnosed with right upper lobe pneumonia.  Started on Rocephin and Zithromax. Patient condition has improved, blood culture came back no growth.  No hypoxemia.  Stable to be discharged.  Will complete of 5 days of antibiotics.

## 2021-10-25 NOTE — Progress Notes (Signed)
  Progress Note   Patient: Dana [name] WFU:[id number] DOB: 1945/12/19 DOA: 10/24/2021     1 DOS: the patient was seen and examined on 10/25/2021   Brief hospital course: Dana [name] is a 76 y.o. female with medical history significant of dCHF, hyperlipidemia, diabetes mellitus, depression with anxiety, tobacco abuse, PAH,, venous insufficiency, who presents with cough and chest pain.  Patient had  severe cough spells, her rib hurts after cough.  No significant short of breath.  By time she arrived in the hospital, patient had a leukocytosis, tachypnea, tachycardia.  She also had acute kidney injury with significant elevation of lactic acid at 4.9.  She has a severe sepsis, chest x-ray also showed vague right upper lobe infiltrates.  She is diagnosed with right upper lobe pneumonia.  Started on Rocephin and Zithromax.  Assessment and Plan: Severe sepsis secondary to pneumonia. Right upper lobe [facility] pneumonia. Acute kidney injury secondary to sepsis. Severe lactic acidosis secondary to sepsis and metformin. Patient met severe sepsis criteria at time of admission with leukocytosis, tachycardia and tachypnea.  Patient also has organ dysfunction with AKI, lactic acidosis.  This is secondary to pneumonia. I personally reviewed patient chest x-ray images, patient has a vague right upper lobe pneumonia. Patient chest pain was due to severe cough, no concern for PE at this time. I will continue Rocephin and azithromycin.  Pending culture. Patient may be able to discharge tomorrow.  Chronic diastolic congestive heart failure. Coronary artery disease. Condition stable, no evidence of volume overload.  No elevation troponin.  Type 2 diabetes. Hold off metformin, continue sliding scale insulin.  Sinus tachycardia. Reviewed patient telemetry, patient has sinus tachycardia.  This is due to sepsis in addition to held beta-blocker.  Restart metoprolol.  Likely COPD. Tobacco  abuse. Patient has significant decreased breathing sounds, consistent with emphysema.  Tobacco cessation strongly encouraged.      Subjective:  Patient still has a cough, nonproductive. No shortness of breath or hypoxia  Physical Exam: Vitals:   10/24/21 2153 10/25/21 0136 10/25/21 0519 10/25/21 0730  BP: 124/64 135/86 (!) 144/69 132/76  Pulse: 91 100 94 (!) 110  Resp: $Remo'18 18 18 18  'BhmFS$ Temp: 98.7 F (37.1 C) 97.6 F (36.4 C) 97.6 F (36.4 C) 98.2 F (36.8 C)  TempSrc:    Oral  SpO2: 100% 95% 100% 99%  Weight:      Height:       General exam: Appears calm and comfortable  Respiratory system: Significant decreased breathing sounds. Respiratory effort normal. Cardiovascular system: S1 & S2 heard, RRR. No JVD, murmurs, rubs, gallops or clicks. No pedal edema. Gastrointestinal system: Abdomen is nondistended, soft and nontender. No organomegaly or masses felt. Normal bowel sounds heard. Central nervous system: Alert and oriented. No focal neurological deficits. Extremities: Symmetric 5 x 5 power. Skin: No rashes, lesions or ulcers Psychiatry: Judgement and insight appear normal. Mood & affect appropriate.   Data Reviewed:  Chest x-ray report and images. Reviewed all lab results.  Family Communication: Granddaughter updated at the bedside.  Disposition: Status is: Inpatient Remains inpatient appropriate because: Severity of disease and IV treatment  Planned Discharge Destination: Home    Time spent: 50 minutes  Author: Sharen Hones, MD 10/25/2021 9:50 AM  For on call review [email or web address].

## 2021-10-25 NOTE — Progress Notes (Signed)
Patient called out asking to use restroom. RN assisted independent patient to restroom due to IV pole with antibx running. Once patient situated in bathroom, RN cracked bathroom door and waited outside. After a few min, RN checked on patient and found patient on her knee in front of the toilet. Patient stated that her tele box started to fall so she tried to catch it and slipped on to her knee. No other contact to the floor. No injuries. Vitals stable. MD aware. Will continue to monitor.

## 2021-10-25 NOTE — Progress Notes (Signed)
PT Cancellation Note  Patient Details Name: Dana [name] MRN: [id number] DOB: 12/03/1945   Cancelled Treatment:    Reason Eval/Treat Not Completed: Other (comment). Consult received, however per discussion with MD and RN staff, pt is indep with mobility, documented as level 6. No formal PT required. Encouraged to continue mobility efforts with RN staff to prevent deconditioning. Will sign off. Please re-order if needs arise.   Nealie Mchatton 10/25/2021, 9:39 AM Elizabeth Palau, PT, DPT, GCS [phone]

## 2021-10-26 DIAGNOSIS — J189 Pneumonia, unspecified organism: Secondary | ICD-10-CM | POA: Diagnosis not present

## 2021-10-26 DIAGNOSIS — A419 Sepsis, unspecified organism: Secondary | ICD-10-CM | POA: Diagnosis not present

## 2021-10-26 DIAGNOSIS — N179 Acute kidney failure, unspecified: Secondary | ICD-10-CM | POA: Diagnosis not present

## 2021-10-26 DIAGNOSIS — R6520 Severe sepsis without septic shock: Secondary | ICD-10-CM | POA: Diagnosis not present

## 2021-10-26 LAB — GLUCOSE, CAPILLARY: Glucose-Capillary: 153 mg/dL — ABNORMAL HIGH (ref 70–99)

## 2021-10-26 LAB — LEGIONELLA PNEUMOPHILA SEROGP 1 UR AG: L. pneumophila Serogp 1 Ur Ag: NEGATIVE

## 2021-10-26 MED ORDER — CEFDINIR 300 MG PO CAPS
300.0000 mg | ORAL_CAPSULE | Freq: Two times a day (BID) | ORAL | Status: DC
Start: 2021-10-26 — End: 2021-10-26
  Administered 2021-10-26: 300 mg via ORAL
  Filled 2021-10-26: qty 1

## 2021-10-26 MED ORDER — CEFDINIR 300 MG PO CAPS
300.0000 mg | ORAL_CAPSULE | Freq: Two times a day (BID) | ORAL | 0 refills | Status: AC
Start: 2021-10-26 — End: 2021-10-30

## 2021-10-26 MED ORDER — AZITHROMYCIN 500 MG PO TABS
500.0000 mg | ORAL_TABLET | Freq: Every day | ORAL | Status: DC
  Administered 2021-10-26: 500 mg via ORAL
  Filled 2021-10-26: qty 1

## 2021-10-26 MED ORDER — AZITHROMYCIN 500 MG PO TABS: 500.0000 mg | ORAL_TABLET | Freq: Every day | ORAL | 0 refills | Status: AC

## 2021-10-26 MED ORDER — GUAIFENESIN-DM 100-10 MG/5ML PO SYRP: 5.0000 mL | ORAL_SOLUTION | ORAL | 0 refills | Status: AC | PRN

## 2021-10-26 NOTE — Care Management (Signed)
  Transition of Care (TOC) Screening Note   Patient Details  Name: [name] Date of Birth: [birth year]   [facility] CM/SW Contact:    Caryn Section, RN Phone Number: 10/26/2021, 8:27 AM    Transition of Care Department [facility]) has reviewed patient and no TOC needs have been identified at this time. We will continue to monitor patient advancement through interdisciplinary progression rounds. If new patient transition needs arise, please place a TOC consult.

## 2021-10-26 NOTE — Discharge Summary (Signed)
Physician Discharge Summary   Patient: Dana [name] MRN: [id number] DOB: [birth year]  Admit date:     10/24/2021  Discharge date: 10/26/21  Discharge Physician: Sharen Hones   PCP: Albina Billet, MD   Recommendations at discharge:   Follow-up with PCP in 1 week  Discharge Diagnoses: Principal Problem:   CAP (community acquired pneumonia) Active Problems:   Severe sepsis ([place])   Chronic diastolic CHF (congestive heart failure) (HCC)   Diabetes mellitus without complication (HCC)   HLD (hyperlipidemia)   CAD (coronary artery disease)   AKI (acute kidney injury) ([place])   Diarrhea   Depression with anxiety   Tobacco abuse  Resolved Problems:   * No resolved hospital problems. *  Hospital Course: Dana [name] is a 76 y.o. female with medical history significant of dCHF, hyperlipidemia, diabetes mellitus, depression with anxiety, tobacco abuse, PAH,, venous insufficiency, who presents with cough and chest pain.  Patient had  severe cough spells, her rib hurts after cough.  No significant short of breath.  By time she arrived in the hospital, patient had a leukocytosis, tachypnea, tachycardia.  She also had acute kidney injury with significant elevation of lactic acid at 4.9.  She has a severe sepsis, chest x-ray also showed vague right upper lobe infiltrates.  She is diagnosed with right upper lobe pneumonia.  Started on Rocephin and Zithromax. Patient condition has improved, blood culture came back no growth.  No hypoxemia.  Stable to be discharged.  Will complete of 5 days of antibiotics.  Assessment and Plan: Severe sepsis secondary to pneumonia. Right upper lobe [facility] pneumonia. Acute kidney injury secondary to sepsis. Severe lactic acidosis secondary to sepsis and metformin. Patient met severe sepsis criteria at time of admission with leukocytosis, tachycardia and tachypnea.  Patient also has organ dysfunction with AKI, lactic acidosis.  This is secondary to  pneumonia. I personally reviewed patient chest x-ray images, patient has a vague right upper lobe pneumonia. Patient chest pain was due to severe cough, no concern for PE at this time. Blood culture came back no growth, condition improved, will continue oral antibiotics with cefdinir and Zithromax.  Chronic diastolic congestive heart failure. Coronary artery disease. Condition stable, no evidence of volume overload.  No elevation troponin.  Type 2 diabetes. Resume metformin time discharged  Sinus tachycardia. Reviewed patient telemetry, patient has sinus tachycardia.  This is due to sepsis in addition to held beta-blocker.  Restart metoprolol.   Likely COPD. Tobacco abuse. Patient has significant decreased breathing sounds, consistent with emphysema.  Tobacco cessation strongly encouraged.         Consultants: None Procedures performed: None  Disposition: Home Diet recommendation:  Discharge Diet Orders (From admission, onward)     Start     Ordered   10/26/21 0000  Diet - low sodium heart healthy        10/26/21 0953           Cardiac diet DISCHARGE MEDICATION: Allergies as of 10/26/2021       Reactions   Azithromycin Other (See Comments)   N/V N/V N/V   Erythromycin Nausea And Vomiting, Other (See Comments)        Medication List     STOP taking these medications    clindamycin 150 MG capsule Commonly known as: CLEOCIN   fluconazole 100 MG tablet Commonly known as: DIFLUCAN   traMADol 50 MG tablet Commonly known as: ULTRAM       TAKE these medications    ALLERGY  MEDICINE PO Take 1 tablet by mouth daily.   aspirin EC 81 MG tablet Take 1 tablet (81 mg total) by mouth daily.   atorvastatin 40 MG tablet Commonly known as: LIPITOR Take 1 tablet (40 mg total) by mouth daily.   azithromycin 500 MG tablet Commonly known as: ZITHROMAX Take 1 tablet (500 mg total) by mouth daily for 4 days.   busPIRone 10 MG tablet Commonly known as:  BUSPAR Take 10 mg by mouth 2 (two) times daily.   cefdinir 300 MG capsule Commonly known as: OMNICEF Take 1 capsule (300 mg total) by mouth every 12 (twelve) hours for 4 days.   CRANBERRY PO Take 1 tablet by mouth as needed.   docusate sodium 100 MG capsule Commonly known as: COLACE Take 100 mg by mouth as needed for mild constipation.   DULoxetine 30 MG capsule Commonly known as: CYMBALTA Take 60 mg by mouth daily.   gabapentin 100 MG capsule Commonly known as: NEURONTIN Take 100 mg by mouth 3 (three) times daily.   guaiFENesin-dextromethorphan 100-10 MG/5ML syrup Commonly known as: ROBITUSSIN DM Take 5 mLs by mouth every 4 (four) hours as needed for cough.   LORazepam 1 MG tablet Commonly known as: ATIVAN TK 1 T PO QHS   metFORMIN 500 MG tablet Commonly known as: GLUCOPHAGE Take 1,000 mg by mouth 2 (two) times daily with a meal.   metoprolol tartrate 50 MG tablet Commonly known as: LOPRESSOR Take 100 mg by mouth 2 (two) times daily.   ramelteon 8 MG tablet Commonly known as: ROZEREM Take 8 mg by mouth at bedtime.        Follow-up Information     Albina Billet, MD Follow up in 1 week(s).   Specialty: Internal Medicine Contact information: [address] [place] [address] [phone]         Wellington Hampshire, MD .   Specialty: Cardiology Contact information: [place] [address] [phone]                Discharge Exam: Dana [name] Weights   10/24/21 1526  Weight: 81.6 kg   General exam: Appears calm and comfortable  Respiratory system: Decreased breath sounds. Respiratory effort normal. Cardiovascular system: S1 & S2 heard, RRR. No JVD, murmurs, rubs, gallops or clicks. No pedal edema. Gastrointestinal system: Abdomen is nondistended, soft and nontender. No organomegaly or masses felt. Normal bowel sounds heard. Central nervous system: Alert and oriented. No focal neurological  deficits. Extremities: Symmetric 5 x 5 power. Skin: No rashes, lesions or ulcers Psychiatry: Judgement and insight appear normal. Mood & affect appropriate.    Condition at discharge: good  The results of significant diagnostics from this hospitalization (including imaging, microbiology, ancillary and laboratory) are listed below for reference.   Imaging Studies: DG Chest 2 View  Result Date: 10/24/2021 CLINICAL DATA:  Chest pain/heaviness, weakness, and cough. EXAM: CHEST - 2 VIEW COMPARISON:  Chest radiographs 06/02/2014 and CT 09/29/2018 FINDINGS: The cardiomediastinal silhouette is within normal limits. Aortic atherosclerosis is noted. There is an approximately 5 cm region of ill-defined opacity in the right upper lobe. The left lung is clear. No pleural effusion or pneumothorax is identified. Underlying emphysema is noted. No acute osseous abnormality is seen. IMPRESSION: Ill-defined right upper lobe opacity concerning for pneumonia. Followup PA and lateral chest X-ray is recommended in 3-4 weeks following trial of antibiotic therapy to ensure resolution and exclude underlying malignancy. Electronically Signed  By: Logan Bores M.D.   On: 10/24/2021 16:05    Microbiology: Results for orders placed or performed during the hospital encounter of 10/24/21  Culture, blood (routine x 2)     Status: None (Preliminary result)   Collection Time: 10/24/21  4:45 PM   Specimen: BLOOD  Result Value Ref Range Status   Specimen Description BLOOD LAC  Final   Special Requests BOTTLES DRAWN AEROBIC AND ANAEROBIC BCAV  Final   Culture   Final    NO GROWTH 2 DAYS Performed at [facility], [address] [place], [place] [address]    Report Status PENDING  Incomplete  Culture, blood (routine x 2)     Status: None (Preliminary result)   Collection Time: 10/24/21  4:54 PM   Specimen: BLOOD  Result Value Ref Range Status   Specimen Description BLOOD RAC  Final   Special Requests BOTTLES  DRAWN AEROBIC AND ANAEROBIC BCAV  Final   Culture   Final    NO GROWTH 2 DAYS Performed at [facility], [address], [place], [place] [address]    Report Status PENDING  Incomplete  SARS Coronavirus 2 by RT PCR (hospital order, performed in [place] hospital lab) *cepheid single result test* Anterior Nasal Swab     Status: None   Collection Time: 10/24/21  4:54 PM   Specimen: Anterior Nasal Swab  Result Value Ref Range Status   SARS Coronavirus 2 by RT PCR NEGATIVE NEGATIVE Final    Comment: (NOTE) SARS-CoV-2 target nucleic acids are NOT DETECTED.  The SARS-CoV-2 RNA is generally detectable in upper and lower respiratory specimens during the acute phase of infection. The lowest concentration of SARS-CoV-2 viral copies this assay can detect is 250 copies / mL. A negative result does not preclude SARS-CoV-2 infection and should not be used as the sole basis for treatment or other patient management decisions.  A negative result may occur with improper specimen collection / handling, submission of specimen other than nasopharyngeal swab, presence of viral mutation(s) within the areas targeted by this assay, and inadequate number of viral copies (<250 copies / mL). A negative result must be combined with clinical observations, patient history, and epidemiological information.  Fact Sheet for Patients:   https://www.patel.info/  Fact Sheet for Healthcare Providers: https://hall.com/  This test is not yet approved or  cleared by the [place] FDA and has been authorized for detection and/or diagnosis of SARS-CoV-2 by FDA under an Emergency Use Authorization (EUA).  This EUA will remain in effect (meaning this test can be used) for the duration of the COVID-19 declaration under Section 564(b)(1) of the Act, 21 U.S.C. section 360bbb-3(b)(1), unless the authorization is terminated or revoked sooner.  Performed at [facility], [place], [place] [address]   C Difficile Quick Screen w PCR reflex     Status: None   Collection Time: 10/24/21  8:11 PM   Specimen: STOOL  Result Value Ref Range Status   C Diff antigen NEGATIVE NEGATIVE Final   C Diff toxin NEGATIVE NEGATIVE Final   C Diff interpretation No C. difficile detected.  Final    Comment: Performed at [facility], [address] [place], [place] [address]  Gastrointestinal Panel by PCR , Stool     Status: None   Collection Time: 10/24/21  8:11 PM   Specimen: STOOL  Result Value Ref Range Status   Campylobacter species NOT DETECTED NOT DETECTED Final   Plesimonas  shigelloides NOT DETECTED NOT DETECTED Final   Salmonella species NOT DETECTED NOT DETECTED Final   Yersinia enterocolitica NOT DETECTED NOT DETECTED Final   Vibrio species NOT DETECTED NOT DETECTED Final   Vibrio cholerae NOT DETECTED NOT DETECTED Final   Enteroaggregative E coli (EAEC) NOT DETECTED NOT DETECTED Final   Enteropathogenic E coli (EPEC) NOT DETECTED NOT DETECTED Final   Enterotoxigenic E coli (ETEC) NOT DETECTED NOT DETECTED Final   Shiga like toxin producing E coli (STEC) NOT DETECTED NOT DETECTED Final   Shigella/Enteroinvasive E coli (EIEC) NOT DETECTED NOT DETECTED Final   Cryptosporidium NOT DETECTED NOT DETECTED Final   Cyclospora cayetanensis NOT DETECTED NOT DETECTED Final   Entamoeba histolytica NOT DETECTED NOT DETECTED Final   Giardia lamblia NOT DETECTED NOT DETECTED Final   Adenovirus F40/41 NOT DETECTED NOT DETECTED Final   Astrovirus NOT DETECTED NOT DETECTED Final   Norovirus GI/GII NOT DETECTED NOT DETECTED Final   Rotavirus A NOT DETECTED NOT DETECTED Final   Sapovirus (I, II, IV, and V) NOT DETECTED NOT DETECTED Final    Comment: Performed at [facility], [address] [place], [place] [address]    Labs: CBC: Recent Labs  Lab 10/24/21 1536 10/25/21 0407  WBC 18.1* 16.0*  HGB 11.9* 11.1*  HCT 38.4  35.2*  MCV 95.8 93.9  PLT 327 582   Basic Metabolic Panel: Recent Labs  Lab 10/24/21 1536 10/25/21 0407  NA 139 140  K 3.5 3.6  CL 102 105  CO2 23 25  GLUCOSE 161* 227*  BUN 17 16  CREATININE 1.33* 0.99  CALCIUM 9.2 8.5*   Liver Function Tests: No results for input(s): "AST", "ALT", "ALKPHOS", "BILITOT", "PROT", "ALBUMIN" in the last 168 hours. CBG: Recent Labs  Lab 10/25/21 0822 10/25/21 1135 10/25/21 1704 10/25/21 2210 10/26/21 0751  GLUCAP 153* 164* 196* 157* 153*    Discharge time spent: greater than 30 minutes.  Signed: Sharen Hones, MD [facility] 10/26/2021

## 2021-10-26 NOTE — Care Management Important Message (Signed)
Important Message  Patient Details  Name: WILLY VORCE MRN: [id number] Date of Birth: 06-Feb-1946   Medicare Important Message Given:  N/A - LOS <3 / Initial given by admissions     Olegario Messier A Sonali Wivell 10/26/2021, 7:38 AM

## 2021-10-29 LAB — CULTURE, BLOOD (ROUTINE X 2)
Culture: NO GROWTH
Culture: NO GROWTH

## 2022-05-21 ENCOUNTER — Other Ambulatory Visit: Payer: Self-pay | Admitting: Physician Assistant

## 2022-05-22 NOTE — Telephone Encounter (Signed)
Please contact pt for future appointment. Pt overdue for 6 month f/u. Pt needing refills. 

## 2022-05-27 NOTE — Telephone Encounter (Signed)
Please contact pt for overdue 6 month f/u, last seen 08-10-21 by Cadence F. Refills pending appt.  Thank you

## 2022-05-27 NOTE — Telephone Encounter (Signed)
*  STAT* If patient is at the pharmacy, call can be transferred to refill team.   1. Which medications need to be refilled? (please list name of each medication and dose if known) Metoprolol  2. Which pharmacy/location (including street and city if local pharmacy) is medication to be sent to?  [facility]  3. Do they need a 30 day or 90 day supply? 90 days # 120 and refills- please call today- had no medicine since Friday

## 2022-05-30 NOTE — Telephone Encounter (Signed)
Patient is schedule for 05/31/22

## 2022-05-31 ENCOUNTER — Ambulatory Visit: Payer: PPO | Attending: Medical | Admitting: Medical

## 2022-05-31 ENCOUNTER — Encounter: Payer: Self-pay | Admitting: Medical

## 2022-05-31 VITALS — BP 110/78 | HR 76 | Ht 66.0 in | Wt 180.6 lb

## 2022-05-31 DIAGNOSIS — R002 Palpitations: Secondary | ICD-10-CM | POA: Diagnosis not present

## 2022-05-31 DIAGNOSIS — I5032 Chronic diastolic (congestive) heart failure: Secondary | ICD-10-CM

## 2022-05-31 DIAGNOSIS — I4510 Unspecified right bundle-branch block: Secondary | ICD-10-CM | POA: Diagnosis not present

## 2022-05-31 DIAGNOSIS — I2721 Secondary pulmonary arterial hypertension: Secondary | ICD-10-CM | POA: Diagnosis not present

## 2022-05-31 DIAGNOSIS — Z720 Tobacco use: Secondary | ICD-10-CM | POA: Diagnosis not present

## 2022-05-31 DIAGNOSIS — I2510 Atherosclerotic heart disease of native coronary artery without angina pectoris: Secondary | ICD-10-CM

## 2022-05-31 MED ORDER — METOPROLOL TARTRATE 50 MG PO TABS: 100.0000 mg | ORAL_TABLET | Freq: Two times a day (BID) | ORAL | 1 refills | Status: DC

## 2022-05-31 NOTE — Progress Notes (Unsigned)
Cardiology Office Note:    Date:  06/02/2022   ID:  Dana [name], DOB December 16, 1945, MRN [id number]  PCP:  Albina Billet, MD  [facility] Cardiologist:  Kathlyn Sacramento, MD  [facility] Electrophysiologist:  None   Referring MD: Albina Billet, MD   Chief Complaint: 6 month follow-up  History of Present Illness:    Dana [name] is a 77 y.o. female with a hx of coronary artery calcification on CT, chronic diastolic heart failure with pulmonary hypertension, tobacco use, HTN, HLD, sinus tachycardia, venous insufficiency, Dm2, carpal tunnel disease presents today for 6 month follow-up.    She has chronic discoloration of her toes with normal ABI in the past. Noted venous insufficiency on venous doppler. Evaluated in 2018 for dyspnea with echo showing hyperdynamic LVEF with moderate to severe pulmonary hypertension. Her previous sinus tachycardia has improved with metoprolol. Previous Lexiscan 2019 was normal. Cardiac MRI with no evidence of infiltrative heart disease and EF 58%. Echo 12/2018 with normal LVEF, gr1DD, pulmonary pressures unable to be estimated.    She was seen 02/2019 by Dr. Fletcher Anon. She was recommended for sleep study which she had completed and tells me it shows no sleep apnea.  At follow-up 03/14/2020 she noted feeling overall well though 2-week history of lightheadedness.  No improvement with Dramamine.  Lightheadedness is most notable with position changes.  EKG was without acute findings.  She was recommended for a ZIO monitor and echo.  ZIO monitor 03/14/2020 showing normal sinus rhythm with average heart rate of 67 bpm, one short run of SVT lasting 4 beats at rate of 118 bpm and rare PAC/PVC with burden of less than 1%. She did not have echocardiogram completed.   Seen 08/02/20 and had worsening fatigue and DOE. Echo was ordered May 2022 which showed EF 60-65%, Grade 1 DD and trivial mitral regurgitation.    The patient was seen 06/15/21 and reported worsening  SOB and fatigue along with palpitations. Echo and heart monitor were ordered. Echo was unchanged. Heart monitor showed predominately NSR with BBB/IVCD, 1 run of SVT lasting 4 beats, rare PACs and PVCs.   Last seen 07/2021 and was overall doing well with stable DOE.   Today, the patient reports she is overall doing well. She has rare chest pain with activity, mostly taking a shower. This is unchanged for the patient. She has occasional SOB. She needs a refill of metoprolol.  Past Medical History:  Diagnosis Date   Coronary artery calcification seen on CT scan    a. 06/2015 CTA Chest: coronary and Ao Ca2+ noted.   Diabetes mellitus without complication ([place])    Diastolic dysfunction    a. 12/2016 Echo: Gr1 DD.   Dyspnea on exertion    a. 05/2015 Stress Echo: normal wall motion, nl LV fxn;  b. 12/2016 Echo: EF 60-65%, small cavity szie w/ near cavity obliteration in systole.  LVOT 81mmHg. No change w/ valsalva. Mild conc LVH, Gr1 DD, mild MR, nl RV fxn, PASP 91mmHg.   Fall at home    DUE TO [facility] W/U NEGATIVE IN MARCH 2017 WITH DR [facility]   Hyperlipidemia    PAH (pulmonary artery hypertension) ([place])    a. 12/2016 Echo: PASP 73mmHg.   Sinus tachycardia    a. 06/2015 Holter: Sinus tach. No significant arrhythmias.   Tobacco abuse    Venous insufficiency    a. 12/2016 ABI wnl; b. 12/2016 Venous doppler: venous incompetence in R saphenous femoral jxn and the great  saphenous vein. Venous incompetence on L. No DVT.    Past Surgical History:  Procedure Laterality Date   CARPAL TUNNEL RELEASE Right 01/18/2016   Procedure: CARPAL TUNNEL RELEASE AND GUYUNS CANAL RIGHT HAND;  Surgeon: Hessie Knows, MD;  Location: [facility] ORS;  Service: Orthopedics;  Laterality: Right;   JOINT REPLACEMENT Left 2003   PARTIAL KNEE   MUSCLE BIOPSY      Current Medications: Current Meds  Medication Sig   Homeopathic Products (ALLERGY MEDICINE PO) Take 1 tablet by mouth daily.   methocarbamol (ROBAXIN) 500 MG  tablet Take 500 mg by mouth. 1/2-1 po qHS prn     Allergies:   Azithromycin and Erythromycin   Social History   Socioeconomic History   Marital status: Divorced    Spouse name: Not on file   Number of children: Not on file   Years of education: Not on file   Highest education level: Not on file  Occupational History   Not on file  Tobacco Use   Smoking status: Some Days    Packs/day: 0.75    Years: 52.00    Total pack years: 39.00    Types: Cigarettes   Smokeless tobacco: Never   Tobacco comments:    2 cigarettes in a month  Vaping Use   Vaping Use: Never used  Substance and Sexual Activity   Alcohol use: No    Comment: RARE   Drug use: No   Sexual activity: Never  Other Topics Concern   Not on file  Social History Narrative   Not on file   Social Determinants of Health   Financial Resource Strain: Not on file  Food Insecurity: Not on file  Transportation Needs: Not on file  Physical Activity: Not on file  Stress: Not on file  Social Connections: Not on file     Family History: The patient's family history includes Heart disease in her father.  ROS:   Please see the history of present illness.     All other systems reviewed and are negative.  EKGs/Labs/Other Studies Reviewed:    The following studies were reviewed today:  Echo 06/2021  1. Left ventricular ejection fraction, by estimation, is 60 to 65%. The  left ventricle has normal function. The left ventricle has no regional  wall motion abnormalities. There is mild left ventricular hypertrophy.  Left ventricular diastolic parameters  are consistent with Grade I diastolic dysfunction (impaired relaxation).   2. Right ventricular systolic function is normal. The right ventricular  size is normal. Mildly increased right ventricular wall thickness.  Tricuspid regurgitation signal is inadequate for assessing PA pressure.   3. The mitral valve is normal in structure. Mild mitral valve  regurgitation. No  evidence of mitral stenosis.   4. The aortic valve was not well visualized. Aortic valve regurgitation  is mild. No aortic stenosis is present.   5. The inferior vena cava is normal in size with greater than 50%  respiratory variability, suggesting right atrial pressure of 3 mmHg.   Heart monitor 06/2021 Patch Wear Time:  13 days and 7 hours (2023-02-24T05:13:44-0500 to 2023-03-09T12:45:34-0500)   Patient had a min HR of 68 bpm, max HR of 119 bpm, and avg HR of 82 bpm.  Predominant underlying rhythm was Sinus Rhythm. Bundle Branch Block/IVCD was present.  1 run of Supraventricular Tachycardia occurred lasting 4 beats with a max rate of 119 bpm (avg 117 bpm). Rare PACs and rare PVCs.  Echo 08/2020  1. Left ventricular ejection fraction,  by estimation, is 60 to 65%. The  left ventricle has normal function. Left ventricular endocardial border  not optimally defined to evaluate regional wall motion. Left ventricular  diastolic parameters are consistent  with Grade I diastolic dysfunction (impaired relaxation).   2. Right ventricular systolic function is normal. The right ventricular  size is normal. There is normal pulmonary artery systolic pressure.   3. The mitral valve was not well visualized. Trivial mitral valve  regurgitation. No evidence of mitral stenosis.   4. The aortic valve was not well visualized. Aortic valve regurgitation  is not visualized. No aortic stenosis is present.   5. The inferior vena cava is normal in size with greater than 50%  respiratory variability, suggesting right atrial pressure of 3 mmHg.   EKG:  EKG is ordered today.  The ekg ordered today demonstrates NSR 76bpm, low voltage, RBBB, no changes  Recent Labs: 10/24/2021: B Natriuretic Peptide 52.9 10/25/2021: BUN 16; Creatinine, Ser 0.99; Hemoglobin 11.1; Platelets 281; Potassium 3.6; Sodium 140  Recent Lipid Panel No results found for: "CHOL", "TRIG", "HDL", "CHOLHDL", "VLDL", "LDLCALC",  "LDLDIRECT"  Physical Exam:    VS:  BP 110/78 (BP Location: Left Arm, Patient Position: Sitting, Cuff Size: Normal)   Pulse 76   Ht 5\' 6"  (1.676 m)   Wt 180 lb 9.6 oz (81.9 kg)   SpO2 98%   BMI 29.15 kg/m     Wt Readings from Last 3 Encounters:  05/31/22 180 lb 9.6 oz (81.9 kg)  10/24/21 180 lb (81.6 kg)  08/10/21 180 lb (81.6 kg)     GEN:  Well nourished, well developed in no acute distress HEENT: Normal NECK: No JVD; No carotid bruits LYMPHATICS: No lymphadenopathy CARDIAC: RRR, no murmurs, rubs, gallops RESPIRATORY:  Clear to auscultation without rales, wheezing or rhonchi  ABDOMEN: Soft, non-tender, non-distended MUSCULOSKELETAL:  No edema; No deformity  SKIN: Warm and dry NEUROLOGIC:  Alert and oriented x 3 PSYCHIATRIC:  Normal affect   ASSESSMENT:    1. PAH (pulmonary artery hypertension) ([place])   2. Chronic diastolic heart failure ([place])   3. RBBB   4. Palpitations   5. Tobacco abuse   6. Coronary artery calcification seen on CT scan    PLAN:    In order of problems listed above:  DOE Mild PAH HFpEF Patient has stable dyspnea on exertion. Echo from 2023 showed LVEF 60-65%, G1DD, mill MR. She is euvolemic on exam. No plan for further work-up at this time. Continue BB therapy.   Palpitations Sinus tachycardia Patient denies palpitations. EKG shows NSR with a heart rate of 76bpm. I will refill metoprolol 100mg  BID today.   Tobacco use Cessation advised.  Coronary artery calcification on CT Patient has unchanged rare chest pain. Continue RF management with medical therapy including Aspirin, statin and BB therapy.   RBBB Stable on EKG.  Disposition: Follow up in 6 month(s) with MD/APP    Signed, Phyllis Whitefield Ninfa Meeker, PA-C  06/02/2022 7:18 PM    [facility]

## 2022-05-31 NOTE — Patient Instructions (Addendum)
Medication Instructions:  No changes *If you need a refill on your cardiac medications before your next appointment, please call your pharmacy*   Lab Work: None ordered If you have labs (blood work) drawn today and your tests are completely normal, you will receive your results only by: MyChart Message (if you have MyChart) OR A paper copy in the mail If you have any lab test that is abnormal or we need to change your treatment, we will call you to review the results.   Testing/Procedures: None ordered   Follow-Up: At [facility], you and your health needs are our priority.  As part of our continuing mission to provide you with exceptional heart care, we have created designated Provider Care Teams.  These Care Teams include your primary Cardiologist (physician) and Advanced Practice Providers (APPs -  Physician Assistants and Nurse Practitioners) who all work together to provide you with the care you need, when you need it.  We recommend signing up for the patient portal called "MyChart".  Sign up information is provided on this After Visit Summary.  MyChart is used to connect with patients for Virtual Visits (Telemedicine).  Patients are able to view lab/test results, encounter notes, upcoming appointments, etc.  Non-urgent messages can be sent to your provider as well.   To learn more about what you can do with MyChart, go to [email or web address].    Your next appointment:   6 month(s)  Provider:   Muhammad Arida, MD   

## 2022-07-04 ENCOUNTER — Other Ambulatory Visit: Payer: Self-pay | Admitting: Family Medicine

## 2022-07-04 DIAGNOSIS — R9389 Abnormal findings on diagnostic imaging of other specified body structures: Secondary | ICD-10-CM

## 2022-07-04 DIAGNOSIS — R918 Other nonspecific abnormal finding of lung field: Secondary | ICD-10-CM

## 2022-07-10 ENCOUNTER — Ambulatory Visit: Admit: 2022-07-10 | Payer: PPO | Admitting: Ophthalmology

## 2022-07-10 SURGERY — PHACOEMULSIFICATION, CATARACT, WITH IOL INSERTION
Anesthesia: Topical | Laterality: Left

## 2022-07-24 ENCOUNTER — Ambulatory Visit: Admit: 2022-07-24 | Payer: PPO | Admitting: Ophthalmology

## 2022-07-24 SURGERY — PHACOEMULSIFICATION, CATARACT, WITH IOL INSERTION
Anesthesia: Topical | Laterality: Right

## 2022-08-16 ENCOUNTER — Other Ambulatory Visit: Payer: Self-pay | Admitting: Physician Assistant

## 2022-08-16 DIAGNOSIS — I2510 Atherosclerotic heart disease of native coronary artery without angina pectoris: Secondary | ICD-10-CM

## 2022-09-08 ENCOUNTER — Other Ambulatory Visit: Payer: Self-pay | Admitting: Medical

## 2022-12-11 ENCOUNTER — Other Ambulatory Visit: Payer: Self-pay | Admitting: Medical

## 2022-12-11 NOTE — Telephone Encounter (Signed)
Hi,   Could you please schedule this patient a 6 month follow up visit? The patient was last seen on 05-31-2022. Thank you so much.

## 2023-02-04 ENCOUNTER — Telehealth: Payer: Self-pay

## 2023-02-04 DIAGNOSIS — I2510 Atherosclerotic heart disease of native coronary artery without angina pectoris: Secondary | ICD-10-CM

## 2023-02-04 MED ORDER — ATORVASTATIN CALCIUM 40 MG PO TABS: 40.0000 mg | ORAL_TABLET | Freq: Every day | ORAL | 0 refills | Status: AC

## 2023-02-04 NOTE — Telephone Encounter (Signed)
Requested Prescriptions   Signed Prescriptions Disp Refills   atorvastatin (LIPITOR) 40 MG tablet 90 tablet 0    Sig: Take 1 tablet (40 mg total) by mouth daily. Due for follow up visit.  PLEASE CALL OFFICE TO SCHEDULE APPOINTMENT PRIOR TO NEXT REFILL    Authorizing Provider: Lorine Bears A    Ordering User: Guerry Minors

## 2023-03-10 ENCOUNTER — Other Ambulatory Visit: Payer: Self-pay | Admitting: Cardiovascular Disease

## 2023-05-19 ENCOUNTER — Encounter: Payer: Self-pay | Admitting: Ophthalmology

## 2023-05-26 NOTE — Discharge Instructions (Signed)

## 2023-05-28 ENCOUNTER — Ambulatory Visit: Payer: PPO | Admitting: Anesthesiology

## 2023-05-28 ENCOUNTER — Other Ambulatory Visit: Payer: Self-pay

## 2023-05-28 ENCOUNTER — Encounter
Admission: RE | Disposition: A | Discharge status: Home / Self Care | Payer: Self-pay | Source: Home / Self Care | Attending: Ophthalmology

## 2023-05-28 ENCOUNTER — Ambulatory Visit
Admission: RE | Admit: 2023-05-28 | Discharge: 2023-05-28 | Disposition: A | Discharge status: Home / Self Care | Payer: PPO | Attending: Ophthalmology | Admitting: Ophthalmology

## 2023-05-28 ENCOUNTER — Encounter: Payer: Self-pay | Admitting: Ophthalmology

## 2023-05-28 DIAGNOSIS — I2510 Atherosclerotic heart disease of native coronary artery without angina pectoris: Secondary | ICD-10-CM | POA: Insufficient documentation

## 2023-05-28 DIAGNOSIS — I110 Hypertensive heart disease with heart failure: Secondary | ICD-10-CM | POA: Diagnosis not present

## 2023-05-28 DIAGNOSIS — H2512 Age-related nuclear cataract, left eye: Secondary | ICD-10-CM | POA: Diagnosis not present

## 2023-05-28 DIAGNOSIS — E1136 Type 2 diabetes mellitus with diabetic cataract: Secondary | ICD-10-CM | POA: Insufficient documentation

## 2023-05-28 DIAGNOSIS — I5032 Chronic diastolic (congestive) heart failure: Secondary | ICD-10-CM | POA: Insufficient documentation

## 2023-05-28 DIAGNOSIS — F17210 Nicotine dependence, cigarettes, uncomplicated: Secondary | ICD-10-CM | POA: Insufficient documentation

## 2023-05-28 HISTORY — DX: Nonrheumatic aortic (valve) insufficiency: I35.1

## 2023-05-28 HISTORY — DX: Pulmonary hypertension, unspecified: I27.20

## 2023-05-28 HISTORY — PX: CATARACT EXTRACTION W/PHACO: SHX586

## 2023-05-28 HISTORY — DX: Nonrheumatic mitral (valve) insufficiency: I34.0

## 2023-05-28 HISTORY — DX: Unspecified right bundle-branch block: I45.10

## 2023-05-28 HISTORY — DX: Supraventricular tachycardia, unspecified: I47.10

## 2023-05-28 HISTORY — DX: Other ill-defined heart diseases: I51.89

## 2023-05-28 HISTORY — DX: Cardiac arrhythmia, unspecified: I49.9

## 2023-05-28 SURGERY — PHACOEMULSIFICATION, CATARACT, WITH IOL INSERTION
Anesthesia: Monitor Anesthesia Care | Laterality: Left

## 2023-05-28 MED ORDER — BRIMONIDINE TARTRATE-TIMOLOL 0.2-0.5 % OP SOLN
OPHTHALMIC | Status: DC | PRN
  Administered 2023-05-28: 1 [drp] via OPHTHALMIC

## 2023-05-28 MED ORDER — MIDAZOLAM HCL 2 MG/2ML IJ SOLN
INTRAMUSCULAR | Status: DC | PRN
  Administered 2023-05-28 (×2): .5 mg via INTRAVENOUS

## 2023-05-28 MED ORDER — MIDAZOLAM HCL 2 MG/2ML IJ SOLN: INTRAMUSCULAR | Status: DC | PRN

## 2023-05-28 MED ORDER — SIGHTPATH DOSE#1 BSS IO SOLN
INTRAOCULAR | Status: DC | PRN
  Administered 2023-05-28: 63 mL via OPHTHALMIC

## 2023-05-28 MED ORDER — ARMC OPHTHALMIC DILATING DROPS
1.0000 | OPHTHALMIC | Status: DC | PRN
  Administered 2023-05-28 (×3): 1 via OPHTHALMIC

## 2023-05-28 MED ORDER — TETRACAINE HCL 0.5 % OP SOLN
1.0000 [drp] | OPHTHALMIC | Status: DC | PRN
  Administered 2023-05-28 (×3): 1 [drp] via OPHTHALMIC

## 2023-05-28 MED ORDER — SIGHTPATH DOSE#1 NA HYALUR & NA CHOND-NA HYALUR IO KIT
PACK | INTRAOCULAR | Status: DC | PRN
  Administered 2023-05-28: 1 via OPHTHALMIC

## 2023-05-28 MED ORDER — FENTANYL CITRATE (PF) 100 MCG/2ML IJ SOLN
INTRAMUSCULAR | Status: DC | PRN
  Administered 2023-05-28 (×2): 25 ug via INTRAVENOUS

## 2023-05-28 MED ORDER — CEFUROXIME OPHTHALMIC INJECTION 1 MG/0.1 ML
INJECTION | OPHTHALMIC | Status: DC | PRN
  Administered 2023-05-28: .1 mL via INTRACAMERAL

## 2023-05-28 MED ORDER — FENTANYL CITRATE (PF) 100 MCG/2ML IJ SOLN
INTRAMUSCULAR | Status: AC
  Filled 2023-05-28: qty 2

## 2023-05-28 MED ORDER — ARMC OPHTHALMIC DILATING DROPS
OPHTHALMIC | Status: AC
  Filled 2023-05-28: qty 0.5

## 2023-05-28 MED ORDER — SIGHTPATH DOSE#1 BSS IO SOLN
INTRAOCULAR | Status: DC | PRN
  Administered 2023-05-28: 1 mL

## 2023-05-28 MED ORDER — MIDAZOLAM HCL 2 MG/2ML IJ SOLN
INTRAMUSCULAR | Status: AC
  Filled 2023-05-28: qty 2

## 2023-05-28 MED ORDER — SIGHTPATH DOSE#1 BSS IO SOLN
INTRAOCULAR | Status: DC | PRN
  Administered 2023-05-28: 15 mL via INTRAOCULAR

## 2023-05-28 MED ORDER — TETRACAINE HCL 0.5 % OP SOLN
OPHTHALMIC | Status: AC
  Filled 2023-05-28: qty 4

## 2023-05-28 SURGICAL SUPPLY — 8 items
CATARACT SUITE SIGHTPATH (MISCELLANEOUS) ×1
FEE CATARACT SUITE SIGHTPATH (MISCELLANEOUS) ×2 IMPLANT
GLOVE SRG 8 PF TXTR STRL LF DI (GLOVE) ×2 IMPLANT
GLOVE SURG ENC TEXT LTX SZ7.5 (GLOVE) ×2 IMPLANT
LENS IOL TECNIS EYHANCE 22.5 (Intraocular Lens) IMPLANT
NDL FILTER BLUNT 18X1 1/2 (NEEDLE) ×2 IMPLANT
NEEDLE FILTER BLUNT 18X1 1/2 (NEEDLE) ×1
SYR 3ML LL SCALE MARK (SYRINGE) ×2 IMPLANT

## 2023-05-28 NOTE — Op Note (Signed)
OPERATIVE NOTE  DORTHULA BIER [id number] 05/28/2023   PREOPERATIVE DIAGNOSIS:  Nuclear sclerotic cataract left eye. H25.12   POSTOPERATIVE DIAGNOSIS:    Nuclear sclerotic cataract left eye.     PROCEDURE:  Phacoemusification with posterior chamber intraocular lens placement of the left eye  Ultrasound time: Procedure(s): CATARACT EXTRACTION PHACO AND INTRAOCULAR LENS PLACEMENT (IOC) LEFT MALYUGIN DIABETIC 8.41, 00:40.5 (Left)  LENS:   Implant Name Type Inv. Item Serial No. Manufacturer Lot No. LRB No. Used Action  LENS IOL TECNIS EYHANCE 22.5 - [id number] Intraocular Lens LENS IOL TECNIS EYHANCE 22.5 [id number] [employer]  Left 1 Implanted      SURGEON:  Deirdre Evener, MD   ANESTHESIA:  Topical with tetracaine drops and 2% Xylocaine jelly, augmented with 1% preservative-free intracameral lidocaine.    COMPLICATIONS:  None.   DESCRIPTION OF PROCEDURE:  The patient was identified in the holding room and transported to the operating room and placed in the supine position under the operating microscope.  The left eye was identified as the operative eye and it was prepped and draped in the usual sterile ophthalmic fashion.   A 1 millimeter clear-corneal paracentesis was made at the 1:30 position.  0.5 ml of preservative-free 1% lidocaine was injected into the anterior chamber.  The anterior chamber was filled with Viscoat viscoelastic.  A 2.4 millimeter keratome was used to make a near-clear corneal incision at the 10:30 position.  .  A curvilinear capsulorrhexis was made with a cystotome and capsulorrhexis forceps.  Balanced salt solution was used to hydrodissect and hydrodelineate the nucleus.   Phacoemulsification was then used in stop and chop fashion to remove the lens nucleus and epinucleus.  The remaining cortex was then removed using the irrigation and aspiration handpiece. Provisc was then placed into the capsular bag to distend it for lens placement.  A lens was  then injected into the capsular bag.  The remaining viscoelastic was aspirated.   Wounds were hydrated with balanced salt solution.  The anterior chamber was inflated to a physiologic pressure with balanced salt solution.  No wound leaks were noted. Cefuroxime 0.1 ml of a 10mg /ml solution was injected into the anterior chamber for a dose of 1 mg of intracameral antibiotic at the completion of the case.   Timolol and Brimonidine drops were applied to the eye.  The patient was taken to the recovery room in stable condition without complications of anesthesia or surgery.  Dollene Mallery 05/28/2023, 9:09 AM

## 2023-05-28 NOTE — Transfer of Care (Signed)
Immediate Anesthesia Transfer of Care Note  Patient: Dana [name]  Procedure(s) Performed: CATARACT EXTRACTION PHACO AND INTRAOCULAR LENS PLACEMENT (IOC) LEFT MALYUGIN DIABETIC 8.41, 00:40.5 (Left)  Patient Location: PACU  Anesthesia Type: MAC  Level of Consciousness: awake, alert  and patient cooperative  Airway and Oxygen Therapy: Patient Spontanous Breathing and Patient connected to supplemental oxygen  Post-op Assessment: Post-op Vital signs reviewed, Patient's Cardiovascular Status Stable, Respiratory Function Stable, Patent Airway and No signs of Nausea or vomiting  Post-op Vital Signs: Reviewed and stable  Complications: No notable events documented.

## 2023-05-28 NOTE — Anesthesia Postprocedure Evaluation (Signed)
Anesthesia Post Note  Patient: [name]  Procedure(s) Performed: CATARACT EXTRACTION PHACO AND INTRAOCULAR LENS PLACEMENT (IOC) LEFT MALYUGIN DIABETIC 8.41, 00:40.5 (Left)  Patient location during evaluation: PACU Anesthesia Type: MAC Level of consciousness: awake and alert Pain management: pain level controlled Vital Signs Assessment: post-procedure vital signs reviewed and stable Respiratory status: spontaneous breathing, nonlabored ventilation, respiratory function stable and patient connected to nasal cannula oxygen Cardiovascular status: stable and blood pressure returned to baseline Postop Assessment: no apparent nausea or vomiting Anesthetic complications: no   No notable events documented.   Last Vitals:  Vitals:   05/28/23 0910 05/28/23 0916  BP: 105/89 106/66  Pulse: 73   Resp: 20   Temp: (!) 36 C   SpO2: 99%     Last Pain:  Vitals:   05/28/23 0916  TempSrc:   PainSc: 0-No pain                 Marisue Humble

## 2023-05-28 NOTE — Anesthesia Preprocedure Evaluation (Signed)
Anesthesia Evaluation  Patient identified by MRN, date of birth, ID band Patient awake    Reviewed: Allergy & Precautions, H&P , NPO status , Patient's Chart, lab work & pertinent test results  Airway Mallampati: I  TM Distance: >3 FB Neck ROM: Full    Dental no notable dental hx. (+) Caps Caps on some lower teeth, uncertain which ones:   Pulmonary neg pulmonary ROS, pneumonia, Current Smoker and Patient abstained from smoking.   Pulmonary exam normal breath sounds clear to auscultation       Cardiovascular Exercise Tolerance: Poor METS: < 3 Mets + CAD and +CHF  Normal cardiovascular exam+ dysrhythmias Supra Ventricular Tachycardia + Valvular Problems/Murmurs MR  Rhythm:Regular Rate:Normal   Chart notes 05-31-22 Dana [name] is a 78 y.o. female with a hx of coronary artery calcification on CT, chronic diastolic heart failure with pulmonary hypertension, tobacco use, HTN, HLD, sinus tachycardia, venous insufficiency, Dm2, carpal tunnel disease presents today for 6 month follow-up.    She has chronic discoloration of her toes with normal ABI in the past. Noted venous insufficiency on venous doppler. Evaluated in 2018 for dyspnea with echo showing hyperdynamic LVEF with moderate to severe pulmonary hypertension. Her previous sinus tachycardia has improved with metoprolol. Previous Lexiscan 2019 was normal. Cardiac MRI with no evidence of infiltrative heart disease and EF 58%. Echo 12/2018 with normal LVEF, gr1DD, pulmonary pressures unable to be estimated.    She was seen 02/2019 by Dr. Kirke Corin. She was recommended for sleep study which she had completed and tells me it shows no sleep apnea.  At follow-up 03/14/2020 she noted feeling overall well though 2-week history of lightheadedness.  No improvement with Dramamine.  Lightheadedness is most notable with position changes.  EKG was without acute findings.  She was recommended for a ZIO  monitor and echo.  ZIO monitor 03/14/2020 showing normal sinus rhythm with average heart rate of 67 bpm, one short run of SVT lasting 4 beats at rate of 118 bpm and rare PAC/PVC with burden of less than 1%. She did not have echocardiogram completed.   Seen 08/02/20 and had worsening fatigue and DOE. Echo was ordered May 2022 which showed EF 60-65%, Grade 1 DD and trivial mitral regurgitation.    The patient was seen 06/15/21 and reported worsening SOB and fatigue along with palpitations. Echo and heart monitor were ordered. Echo was unchanged. Heart monitor showed predominately NSR with BBB/IVCD, 1 run of SVT lasting 4 beats, rare PACs and PVCs.       Neuro/Psych  PSYCHIATRIC DISORDERS Anxiety Depression     Neuromuscular disease negative neurological ROS  negative psych ROS   GI/Hepatic negative GI ROS, Neg liver ROS,,,  Endo/Other  negative endocrine ROSdiabetes    Renal/GU Renal diseasenegative Renal ROS  negative genitourinary   Musculoskeletal negative musculoskeletal ROS (+) Arthritis ,    Abdominal   Peds negative pediatric ROS (+)  Hematology negative hematology ROS (+)   Anesthesia Other Findings Diabetes mellitus   Fall at home Hyperlipidemia  Tobacco abuse Venous insufficiency  Dyspnea on exertion PAH (pulmonary artery hypertension)  Sinus tachycardia Coronary artery calcification seen on CT scan  Dysrhythmia  Moderate to severe pulmonary hypertension Grade I diastolic dysfunction Mild Mitral regurg Mild aortic regurg    Reproductive/Obstetrics negative OB ROS                              Anesthesia Physical Anesthesia Plan  ASA: 4  Anesthesia Plan: MAC   Post-op Pain Management:    Induction: Intravenous  PONV Risk Score and Plan:   Airway Management Planned: Natural Airway and Nasal Cannula  Additional Equipment:   Intra-op Plan:   Post-operative Plan:   Informed Consent: I have reviewed the patients History and  Physical, chart, labs and discussed the procedure including the risks, benefits and alternatives for the proposed anesthesia with the patient or authorized representative who has indicated his/her understanding and acceptance.     Dental Advisory Given  Plan Discussed with: Anesthesiologist, CRNA and Surgeon  Anesthesia Plan Comments: (Patient consented for risks of anesthesia including but not limited to:  - adverse reactions to medications - damage to eyes, teeth, lips or other oral mucosa - nerve damage due to positioning  - sore throat or hoarseness - Damage to heart, brain, nerves, lungs, other parts of body or loss of life  Patient voiced understanding and assent. Discussed with patient that she has moderate to severe pulmonary hypertension. The surgery will not hurt, due to eye drops ordered by surgeon and administered by nurse. Discussed with patient risks of hypoxia (low oxygen levels) and hypercarbia (high carbon dioxide levels) and that either of these are risky for this patient, so will proceed with "light" sedation as we do not want to depress her respirations in any way. Emphasized that she will not be in pain.  Patient agreed. )         Anesthesia Quick Evaluation

## 2023-05-28 NOTE — H&P (Signed)
[facility]   Primary Care Physician:  Jaclyn Shaggy, MD Ophthalmologist: Dr. Lockie Mola  Pre-Procedure History & Physical: HPI:  Dana [name] is a 78 y.o. female here for ophthalmic surgery.   Past Medical History:  Diagnosis Date   Coronary artery calcification seen on CT scan    a. 06/2015 CTA Chest: coronary and Ao Ca2+ noted.   Diabetes mellitus without complication (HCC)    Diastolic dysfunction    a. 12/2016 Echo: Gr1 DD.   Dyspnea on exertion    a. 05/2015 Stress Echo: normal wall motion, nl LV fxn;  b. 12/2016 Echo: EF 60-65%, small cavity szie w/ near cavity obliteration in systole.  LVOT . No change w/ valsalva. Mild conc LVH, Gr1 DD, mild MR, nl RV fxn, PASP .   Dysrhythmia    Fall at home    DUE TO [place] W/U NEGATIVE IN MARCH 2017 WITH DR [facility]   Hyperlipidemia    PAH (pulmonary artery hypertension) (HCC)    a. 12/2016 Echo: PASP .   Sinus tachycardia    a. 06/2015 Holter: Sinus tach. No significant arrhythmias.   Tobacco abuse    Venous insufficiency    a. 12/2016 ABI wnl; b. 12/2016 Venous doppler: venous incompetence in R saphenous femoral jxn and the great saphenous vein. Venous incompetence on L. No DVT.    Past Surgical History:  Procedure Laterality Date   CARPAL TUNNEL RELEASE Right 01/18/2016   Procedure: CARPAL TUNNEL RELEASE AND GUYUNS CANAL RIGHT HAND;  Surgeon: Kennedy Bucker, MD;  Location: [facility] ORS;  Service: Orthopedics;  Laterality: Right;   JOINT REPLACEMENT Left 2003   PARTIAL KNEE   MUSCLE BIOPSY      Prior to Admission medications   Medication Sig Start Date End Date Taking? Authorizing Provider  aspirin EC 81 MG tablet Take 1 tablet (81 mg total) by mouth daily. 09/25/17  Yes Dunn, Raymon Mutton, PA-C  atorvastatin (LIPITOR) 40 MG tablet Take 1 tablet (40 mg total) by mouth daily. Due for follow up visit.  PLEASE CALL OFFICE TO SCHEDULE APPOINTMENT PRIOR TO NEXT REFILL 02/04/23  Yes Iran Ouch,  MD  fluticasone (FLONASE) 50 MCG/ACT nasal spray Place 2 sprays into both nostrils daily.   Yes [provider]  gabapentin (NEURONTIN) 100 MG capsule Take 100 mg by mouth 3 (three) times daily. 07/25/21  Yes [provider]  LORazepam (ATIVAN) 1 MG tablet TK 1 T PO QHS 12/05/17  Yes [provider]  metFORMIN (GLUCOPHAGE) 500 MG tablet Take 1,000 mg by mouth 2 (two) times daily with a meal.    Yes [provider]  methocarbamol (ROBAXIN) 500 MG tablet Take 500 mg by mouth. 1/2-1 po qHS prn 05/21/22  Yes [provider]  metoprolol tartrate (LOPRESSOR) 50 MG tablet TAKE 2 TABLETS BY MOUTH TWICE DAILY. PLEASE CALL OFFICE TO SCHEDULE APPOINTMENT 03/10/23  Yes Iran Ouch, MD  busPIRone (BUSPAR) 10 MG tablet Take 10 mg by mouth 2 (two) times daily. Patient not taking: Reported on 05/19/2023 07/17/20   [provider]  CRANBERRY PO Take 1 tablet by mouth as needed.     [provider]  docusate sodium (COLACE) 100 MG capsule Take 100 mg by mouth as needed for mild constipation. Patient not taking: Reported on 05/31/2022    [provider]  DULoxetine (CYMBALTA) 30 MG capsule Take 60 mg by mouth daily. Patient not taking: Reported on 05/19/2023 12/05/16   [provider]  guaiFENesin-dextromethorphan (  ROBITUSSIN DM) 100-10 MG/5ML syrup Take 5 mLs by mouth every 4 (four) hours as needed for cough. Patient not taking: Reported on 05/31/2022 10/26/21   Marrion Coy, MD  Homeopathic Products (ALLERGY MEDICINE PO) Take 1 tablet by mouth daily. Patient not taking: Reported on 05/19/2023    [provider]  metoprolol tartrate (LOPRESSOR) 50 MG tablet TAKE 2 TABLETS BY MOUTH TWICE DAILY. PLEASE CALL OFFICE TO SCHEDULE APPOINTMENT Patient not taking: Reported on 05/19/2023 03/10/23   Iran Ouch, MD  ramelteon (ROZEREM) 8 MG tablet Take 8 mg by mouth at bedtime. Patient not taking: Reported on 10/24/2021 06/26/21   [provider]    Allergies as of 05/16/2023 - Review Complete 05/31/2022  Allergen Reaction Noted   Azithromycin Other (See Comments) 12/30/2017   Erythromycin Nausea And Vomiting and Other (See Comments) 07/10/2015    Family History  Problem Relation Age of Onset   Heart disease Father     Social History   Socioeconomic History   Marital status: Divorced    Spouse name: Not on file   Number of children: Not on file   Years of education: Not on file   Highest education level: Not on file  Occupational History   Not on file  Tobacco Use   Smoking status: Some Days    Current packs/day: 0.75    Average packs/day: 0.8 packs/day for 52.0 years (39.0 ttl pk-yrs)    Types: Cigarettes   Smokeless tobacco: Never   Tobacco comments:    2 cigarettes in a month  Vaping Use   Vaping status: Never Used  Substance and Sexual Activity   Alcohol use: No    Comment: RARE   Drug use: No   Sexual activity: Never  Other Topics Concern   Not on file  Social History Narrative   Not on file   Social Drivers of Health   Financial Resource Strain: Not on file  Food Insecurity: Not on file  Transportation Needs: Not on file  Physical Activity: Not on file  Stress: Not on file  Social Connections: Not on file  Intimate Partner Violence: Not on file    Review of Systems: See HPI, otherwise negative ROS  Physical Exam: BP (!) 118/59   Pulse 76   Temp 97.9 F (36.6 C) (Temporal)   Resp (!) 24   Ht 5\' 5"  (1.651 m)   Wt 75.4 kg   SpO2 99%   BMI 27.67 kg/m  General:   Alert,  pleasant and cooperative in NAD Head:  Normocephalic and atraumatic. Lungs:  Clear to auscultation.    Heart:  Regular rate and rhythm.   Impression/Plan: Dana [name] is here for ophthalmic surgery.  Risks, benefits, limitations, and alternatives regarding ophthalmic surgery have been reviewed with the patient.  Questions have been answered.  All parties agreeable.   Lockie Mola,  MD  05/28/2023, 8:02 AM

## 2023-05-29 ENCOUNTER — Encounter: Payer: Self-pay | Admitting: Ophthalmology

## 2023-06-04 ENCOUNTER — Other Ambulatory Visit: Payer: Self-pay | Admitting: Cardiovascular Disease

## 2023-06-05 ENCOUNTER — Encounter: Payer: Self-pay | Admitting: *Deleted

## 2023-06-06 ENCOUNTER — Encounter: Payer: Self-pay | Admitting: Medical

## 2023-06-06 ENCOUNTER — Ambulatory Visit: Payer: PPO | Attending: Medical | Admitting: Medical

## 2023-06-06 VITALS — BP 140/72 | HR 79 | Ht 65.0 in | Wt 170.6 lb

## 2023-06-06 DIAGNOSIS — I5032 Chronic diastolic (congestive) heart failure: Secondary | ICD-10-CM | POA: Diagnosis not present

## 2023-06-06 DIAGNOSIS — Z720 Tobacco use: Secondary | ICD-10-CM

## 2023-06-06 DIAGNOSIS — I2510 Atherosclerotic heart disease of native coronary artery without angina pectoris: Secondary | ICD-10-CM

## 2023-06-06 DIAGNOSIS — R600 Localized edema: Secondary | ICD-10-CM | POA: Diagnosis not present

## 2023-06-06 DIAGNOSIS — Z79899 Other long term (current) drug therapy: Secondary | ICD-10-CM

## 2023-06-06 MED ORDER — HYDROCHLOROTHIAZIDE 12.5 MG PO CAPS: 12.5000 mg | ORAL_CAPSULE | Freq: Every day | ORAL | 3 refills | Status: DC

## 2023-06-06 NOTE — Progress Notes (Signed)
 Cardiology Office Note:  .   Date:  06/06/2023  ID:  Dana [name], DOB 1946/02/14, MRN [id number] PCP: Corlis Honor BROCKS, MD   HeartCare Providers Cardiologist:  Deatrice Cage, MD     History of Present Illness: .   Dana [name] is a 78 y.o. female with a h/o coronary artery calcification on CT, chronic diastolic heart failure with pulmonary hypertension, tobacco use, HTN, HLD, sinus tachycardia, venous insufficiency, DC2, carpal tunnel disease presents today for 6 month follow-up.    She has chronic discoloration of her toes with normal ABI in the past. Noted venous insufficiency on venous doppler. Evaluated in 2018 for dyspnea with echo showing hyperdynamic LVEF with moderate to severe pulmonary hypertension. Her previous sinus tachycardia has improved with metoprolol . Previous Lexiscan  2019 was normal. Cardiac MRI with no evidence of infiltrative heart disease and EF 58%. Echo 12/2018 with normal LVEF, gr1DD, pulmonary pressures unable to be estimated.    She was seen 02/2019 by Dr. Cage. She was recommended for sleep study which she had completed and tells me it shows no sleep apnea.  At follow-up 03/14/2020 she noted feeling overall well though 2-week history of lightheadedness.  No improvement with Dramamine.  Lightheadedness is most notable with position changes.  EKG was without acute findings.  She was recommended for a ZIO monitor and echo.  ZIO monitor 03/14/2020 showing normal sinus rhythm with average heart rate of 67 bpm, one short run of SVT lasting 4 beats at rate of 118 bpm and rare PAC/PVC with burden of less than 1%. She did not have echocardiogram completed.   Seen 08/02/20 and had worsening fatigue and DOE. Echo was ordered May 2022 which showed EF 60-65%, Grade 1 DD and trivial mitral regurgitation.    The patient was seen 06/15/21 and reported worsening SOB and fatigue along with palpitations. Echo and heart monitor were ordered. Echo was unchanged. Heart  monitor showed predominately NSR with BBB/IVCD, 1 run of SVT lasting 4 beats, rare PACs and PVCs.   The patient was last seen 05/31/22 and was overall doing well. She reported rare chest pain with activity. No further ischemic work-up was pursued.   Today, the patient reports she has been doing well. She has cataract surgery last week. She reports shortness of breath on exertion. She also reports lower leg edema. It improves overnight. She tries to keep feet elevated. She has occasional chest discomfort at night described as a pressure. She eats a low salt diet.   Studies Reviewed: SABRA   EKG Interpretation Date/Time:  Friday June 06 2023 14:23:12 EST Ventricular Rate:  79 PR Interval:  170 QRS Duration:  112 QT Interval:  394 QTC Calculation: 451 R Axis:   64  Text Interpretation: Normal sinus rhythm Low voltage QRS Incomplete right bundle branch block When compared with ECG of 24-Oct-2021 15:36, Confirmed by Franchester, Levy Cedano ([id number]) on 06/06/2023 2:25:29 PM    Echo 06/2021  1. Left ventricular ejection fraction, by estimation, is 60 to 65%. The  left ventricle has normal function. The left ventricle has no regional  wall motion abnormalities. There is mild left ventricular hypertrophy.  Left ventricular diastolic parameters  are consistent with Grade I diastolic dysfunction (impaired relaxation).   2. Right ventricular systolic function is normal. The right ventricular  size is normal. Mildly increased right ventricular wall thickness.  Tricuspid regurgitation signal is inadequate for assessing PA pressure.   3. The mitral valve is normal in structure. Mild mitral valve  regurgitation. No evidence of mitral stenosis.   4. The aortic valve was not well visualized. Aortic valve regurgitation  is mild. No aortic stenosis is present.   5. The inferior vena cava is normal in size with greater than 50%  respiratory variability, suggesting right atrial pressure of 3 mmHg.    Heart monitor  06/2021 Patch Wear Time:  13 days and 7 hours (2023-02-24T05:13:44-0500 to 2023-03-09T12:45:34-0500)   Patient had a min HR of 68 bpm, max HR of 119 bpm, and avg HR of 82 bpm.  Predominant underlying rhythm was Sinus Rhythm. Bundle Branch Block/IVCD was present.  1 run of Supraventricular Tachycardia occurred lasting 4 beats with a max rate of 119 bpm (avg 117 bpm). Rare PACs and rare PVCs.   Echo 08/2020  1. Left ventricular ejection fraction, by estimation, is 60 to 65%. The  left ventricle has normal function. Left ventricular endocardial border  not optimally defined to evaluate regional wall motion. Left ventricular  diastolic parameters are consistent  with Grade I diastolic dysfunction (impaired relaxation).   2. Right ventricular systolic function is normal. The right ventricular  size is normal. There is normal pulmonary artery systolic pressure.   3. The mitral valve was not well visualized. Trivial mitral valve  regurgitation. No evidence of mitral stenosis.   4. The aortic valve was not well visualized. Aortic valve regurgitation  is not visualized. No aortic stenosis is present.   5. The inferior vena cava is normal in size with greater than 50%  respiratory variability, suggesting right atrial pressure of 3 mmHg.          Physical Exam:   VS:  BP (!) 140/72 (BP Location: Left Arm, Patient Position: Sitting, Cuff Size: Normal)   Pulse 79   Ht 5' 5 (1.651 m)   Wt 170 lb 9.6 oz (77.4 kg)   SpO2 97%   BMI 28.39 kg/m    Wt Readings from Last 3 Encounters:  06/06/23 170 lb 9.6 oz (77.4 kg)  05/28/23 166 lb 4.8 oz (75.4 kg)  05/31/22 180 lb 9.6 oz (81.9 kg)    GEN: Well nourished, well developed in no acute distress NECK: No JVD; No carotid bruits CARDIAC: RRR, no murmurs, rubs, gallops RESPIRATORY:  Clear to auscultation without rales, wheezing or rhonchi  ABDOMEN: Soft, non-tender, non-distended EXTREMITIES:  trace lower leg edema; No deformity   ASSESSMENT AND  PLAN: .    Lower leg edema HFpEF The patient reports lower leg edema for the last few months. It improves overnight. She eats low salt and elevates her feet. On exam, she has trace edema. I will start hydrochlorothiazide  12.5mg  daily, BMET in 2 weeks. I will update an echocardiogram.   Tobacco use She is still smoking, cessation recommended.   Coronary calcification on CT She reports occasional chest pressure at night. Discussed Cardiac CTA, but we will defer at this time. Continue ASA, Lipitor and BB.   Dispo: follow-up in 1 month  Signed, Mats Jeanlouis VEAR Fishman, PA-C

## 2023-06-06 NOTE — Patient Instructions (Addendum)
 Medication Instructions:  Your physician recommends the following medication changes.  START TAKING: Hydrochlorothiazide  12.5 mg by mouth daily   *If you need a refill on your cardiac medications before your next appointment, please call your pharmacy*   Lab Work: Your provider would like for you to return in 2 weeks to have the following labs drawn: (BMP).   Please go to [facility] [address] ([facility] [address], [place] [address] You do not need an appointment.  They are open from 8 am- 4:30 pm.  Lunch from 1:00 pm- 2:00 pm You will not need to be fasting.    Testing/Procedures: Your physician has requested that you have an echocardiogram. Echocardiography is a painless test that uses sound waves to create images of your heart. It provides your doctor with information about the size and shape of your heart and how well your heart's chambers and valves are working.   You may receive an ultrasound enhancing agent through an IV if needed to better visualize your heart during the echo. This procedure takes approximately one hour.  There are no restrictions for this procedure.  This will take place at [address] [facility]) [address], [place] [address]  Please note: We ask at that you not bring children with you during ultrasound (echo/ vascular) testing. Due to room size and safety concerns, children are not allowed in the ultrasound rooms during exams. Our front office staff cannot provide observation of children in our lobby area while testing is being conducted. An adult accompanying a patient to their appointment will only be allowed in the ultrasound room at the discretion of the ultrasound technician under special circumstances. We apologize for any inconvenience.    Follow-Up: At [facility], you and your health needs are our priority.  As part of our continuing mission to provide you with exceptional heart care, we have  created designated Provider Care Teams.  These Care Teams include your primary Cardiologist (physician) and Advanced Practice Providers (APPs -  Physician Assistants and Nurse Practitioners) who all work together to provide you with the care you need, when you need it.  We recommend signing up for the patient portal called MyChart.  Sign up information is provided on this After Visit Summary.  MyChart is used to connect with patients for Virtual Visits (Telemedicine).  Patients are able to view lab/test results, encounter notes, upcoming appointments, etc.  Non-urgent messages can be sent to your provider as well.   To learn more about what you can do with MyChart, go to [email or web address].    Your next appointment:   1 month(s)  Provider:   You may see Deatrice Cage, MD or one of the following Advanced Practice Providers on your designated Care Team:   Cadence Lewis, PA-C

## 2023-06-09 NOTE — Discharge Instructions (Signed)

## 2023-06-11 ENCOUNTER — Encounter
Admission: RE | Disposition: A | Discharge status: Home / Self Care | Payer: Self-pay | Source: Home / Self Care | Attending: Ophthalmology

## 2023-06-11 ENCOUNTER — Encounter: Payer: Self-pay | Admitting: Ophthalmology

## 2023-06-11 ENCOUNTER — Other Ambulatory Visit: Payer: Self-pay

## 2023-06-11 ENCOUNTER — Ambulatory Visit
Admission: RE | Admit: 2023-06-11 | Discharge: 2023-06-11 | Disposition: A | Discharge status: Home / Self Care | Payer: PPO | Attending: Ophthalmology | Admitting: Ophthalmology

## 2023-06-11 ENCOUNTER — Ambulatory Visit: Payer: PPO | Admitting: Anesthesiology

## 2023-06-11 DIAGNOSIS — I872 Venous insufficiency (chronic) (peripheral): Secondary | ICD-10-CM | POA: Insufficient documentation

## 2023-06-11 DIAGNOSIS — I5032 Chronic diastolic (congestive) heart failure: Secondary | ICD-10-CM | POA: Insufficient documentation

## 2023-06-11 DIAGNOSIS — I2721 Secondary pulmonary arterial hypertension: Secondary | ICD-10-CM | POA: Insufficient documentation

## 2023-06-11 DIAGNOSIS — Z79899 Other long term (current) drug therapy: Secondary | ICD-10-CM | POA: Insufficient documentation

## 2023-06-11 DIAGNOSIS — E785 Hyperlipidemia, unspecified: Secondary | ICD-10-CM | POA: Diagnosis not present

## 2023-06-11 DIAGNOSIS — Z7982 Long term (current) use of aspirin: Secondary | ICD-10-CM | POA: Diagnosis not present

## 2023-06-11 DIAGNOSIS — F17210 Nicotine dependence, cigarettes, uncomplicated: Secondary | ICD-10-CM | POA: Insufficient documentation

## 2023-06-11 DIAGNOSIS — E1136 Type 2 diabetes mellitus with diabetic cataract: Secondary | ICD-10-CM | POA: Diagnosis not present

## 2023-06-11 DIAGNOSIS — H2511 Age-related nuclear cataract, right eye: Secondary | ICD-10-CM | POA: Diagnosis present

## 2023-06-11 DIAGNOSIS — Z7984 Long term (current) use of oral hypoglycemic drugs: Secondary | ICD-10-CM | POA: Insufficient documentation

## 2023-06-11 DIAGNOSIS — I110 Hypertensive heart disease with heart failure: Secondary | ICD-10-CM | POA: Diagnosis not present

## 2023-06-11 DIAGNOSIS — I2510 Atherosclerotic heart disease of native coronary artery without angina pectoris: Secondary | ICD-10-CM | POA: Insufficient documentation

## 2023-06-11 HISTORY — PX: CATARACT EXTRACTION W/PHACO: SHX586

## 2023-06-11 LAB — GLUCOSE, CAPILLARY: Glucose-Capillary: 128 mg/dL — ABNORMAL HIGH (ref 70–99)

## 2023-06-11 SURGERY — PHACOEMULSIFICATION, CATARACT, WITH IOL INSERTION
Anesthesia: Monitor Anesthesia Care | Site: Eye | Laterality: Right

## 2023-06-11 MED ORDER — SIGHTPATH DOSE#1 BSS IO SOLN
INTRAOCULAR | Status: DC | PRN
  Administered 2023-06-11: 2 mL

## 2023-06-11 MED ORDER — MIDAZOLAM HCL 2 MG/2ML IJ SOLN
INTRAMUSCULAR | Status: DC | PRN
  Administered 2023-06-11: 1 mg via INTRAVENOUS

## 2023-06-11 MED ORDER — CEFUROXIME OPHTHALMIC INJECTION 1 MG/0.1 ML
INJECTION | OPHTHALMIC | Status: DC | PRN
  Administered 2023-06-11: 1 mg via INTRACAMERAL

## 2023-06-11 MED ORDER — BRIMONIDINE TARTRATE-TIMOLOL 0.2-0.5 % OP SOLN
OPHTHALMIC | Status: DC | PRN
  Administered 2023-06-11: 1 [drp] via OPHTHALMIC

## 2023-06-11 MED ORDER — SIGHTPATH DOSE#1 BSS IO SOLN
INTRAOCULAR | Status: DC | PRN
  Administered 2023-06-11: 15 mL via INTRAOCULAR

## 2023-06-11 MED ORDER — TETRACAINE HCL 0.5 % OP SOLN
1.0000 [drp] | OPHTHALMIC | Status: DC | PRN
  Administered 2023-06-11 (×3): 1 [drp] via OPHTHALMIC

## 2023-06-11 MED ORDER — FENTANYL CITRATE (PF) 100 MCG/2ML IJ SOLN
INTRAMUSCULAR | Status: DC | PRN
  Administered 2023-06-11: 50 ug via INTRAVENOUS

## 2023-06-11 MED ORDER — ARMC OPHTHALMIC DILATING DROPS
1.0000 | OPHTHALMIC | Status: DC | PRN
  Administered 2023-06-11 (×3): 1 via OPHTHALMIC

## 2023-06-11 MED ORDER — MIDAZOLAM HCL 2 MG/2ML IJ SOLN
INTRAMUSCULAR | Status: AC
  Filled 2023-06-11: qty 2

## 2023-06-11 MED ORDER — SIGHTPATH DOSE#1 NA HYALUR & NA CHOND-NA HYALUR IO KIT
PACK | INTRAOCULAR | Status: DC | PRN
  Administered 2023-06-11: 1 via OPHTHALMIC

## 2023-06-11 MED ORDER — TETRACAINE HCL 0.5 % OP SOLN
OPHTHALMIC | Status: AC
  Filled 2023-06-11: qty 4

## 2023-06-11 MED ORDER — SIGHTPATH DOSE#1 BSS IO SOLN
INTRAOCULAR | Status: DC | PRN
  Administered 2023-06-11: 74 mL via OPHTHALMIC

## 2023-06-11 MED ORDER — ARMC OPHTHALMIC DILATING DROPS
OPHTHALMIC | Status: AC
  Filled 2023-06-11: qty 0.5

## 2023-06-11 MED ORDER — FENTANYL CITRATE (PF) 100 MCG/2ML IJ SOLN
INTRAMUSCULAR | Status: AC
  Filled 2023-06-11: qty 2

## 2023-06-11 SURGICAL SUPPLY — 8 items
CATARACT SUITE SIGHTPATH (MISCELLANEOUS) ×1 IMPLANT
FEE CATARACT SUITE SIGHTPATH (MISCELLANEOUS) ×2 IMPLANT
GLOVE SRG 8 PF TXTR STRL LF DI (GLOVE) ×2 IMPLANT
GLOVE SURG ENC TEXT LTX SZ7.5 (GLOVE) ×2 IMPLANT
LENS IOL TECNIS EYHANCE 22.5 (Intraocular Lens) IMPLANT
NDL FILTER BLUNT 18X1 1/2 (NEEDLE) ×2 IMPLANT
NEEDLE FILTER BLUNT 18X1 1/2 (NEEDLE) ×1 IMPLANT
SYR 3ML LL SCALE MARK (SYRINGE) ×2 IMPLANT

## 2023-06-11 NOTE — Op Note (Signed)
LOCATION:  [facility]   PREOPERATIVE DIAGNOSIS:    Nuclear sclerotic cataract right eye. H25.11   POSTOPERATIVE DIAGNOSIS:  Nuclear sclerotic cataract right eye.     PROCEDURE:  Phacoemusification with posterior chamber intraocular lens placement of the right eye   ULTRASOUND TIME: Procedure(s): CATARACT EXTRACTION PHACO AND INTRAOCULAR LENS PLACEMENT (IOC) RIGHT DIABETIC 6.34 00:36.3 (Right)  LENS:   Implant Name Type Inv. Item Serial No. Manufacturer Lot No. LRB No. Used Action  LENS IOL TECNIS EYHANCE 22.5 - [id number] Intraocular Lens LENS IOL TECNIS EYHANCE 22.5 [id number] [employer]  Right 1 Implanted         SURGEON:  Deirdre Evener, MD   ANESTHESIA:  Topical with tetracaine drops and 2% Xylocaine jelly, augmented with 1% preservative-free intracameral lidocaine.    COMPLICATIONS:  None.   DESCRIPTION OF PROCEDURE:  The patient was identified in the holding room and transported to the operating room and placed in the supine position under the operating microscope.  The right eye was identified as the operative eye and it was prepped and draped in the usual sterile ophthalmic fashion.   A 1 millimeter clear-corneal paracentesis was made at the 12:00 position.  0.5 ml of preservative-free 1% lidocaine was injected into the anterior chamber. The anterior chamber was filled with Viscoat viscoelastic.  A 2.4 millimeter keratome was used to make a near-clear corneal incision at the 9:00 position.  A curvilinear capsulorrhexis was made with a cystotome and capsulorrhexis forceps.  Balanced salt solution was used to hydrodissect and hydrodelineate the nucleus.   Phacoemulsification was then used in stop and chop fashion to remove the lens nucleus and epinucleus.  The remaining cortex was then removed using the irrigation and aspiration handpiece. Provisc was then placed into the capsular bag to distend it for lens placement.  A lens was then injected into the capsular  bag.  The remaining viscoelastic was aspirated.   Wounds were hydrated with balanced salt solution.  The anterior chamber was inflated to a physiologic pressure with balanced salt solution.  No wound leaks were noted. Cefuroxime 0.1 ml of a 10mg /ml solution was injected into the anterior chamber for a dose of 1 mg of intracameral antibiotic at the completion of the case.   Timolol and Brimonidine drops were applied to the eye.  The patient was taken to the recovery room in stable condition without complications of anesthesia or surgery.   Khallid Pasillas 06/11/2023, 8:01 AM

## 2023-06-11 NOTE — Transfer of Care (Signed)
Immediate Anesthesia Transfer of Care Note  Patient: Dana [name]  Procedure(s) Performed: CATARACT EXTRACTION PHACO AND INTRAOCULAR LENS PLACEMENT (IOC) RIGHT DIABETIC 6.34 00:36.3 (Right: Eye)  Patient Location: PACU  Anesthesia Type: MAC  Level of Consciousness: awake, alert  and patient cooperative  Airway and Oxygen Therapy: Patient Spontanous Breathing and Patient connected to supplemental oxygen  Post-op Assessment: Post-op Vital signs reviewed, Patient's Cardiovascular Status Stable, Respiratory Function Stable, Patent Airway and No signs of Nausea or vomiting  Post-op Vital Signs: Reviewed and stable  Complications: No notable events documented.

## 2023-06-11 NOTE — H&P (Signed)
[facility]   Primary Care Physician:  Jaclyn Shaggy, MD Ophthalmologist: Dr. Lockie Mola  Pre-Procedure History & Physical: HPI:  Dana [name] is a 78 y.o. female here for ophthalmic surgery.   Past Medical History:  Diagnosis Date   Coronary artery calcification seen on CT scan    a. 06/2015 CTA Chest: coronary and Ao Ca2+ noted.   Diabetes mellitus without complication (HCC)    Diastolic dysfunction    a. 12/2016 Echo: Gr1 DD.   Dyspnea on exertion    a. 05/2015 Stress Echo: normal wall motion, nl LV fxn;  b. 12/2016 Echo: EF 60-65%, small cavity szie w/ near cavity obliteration in systole.  LVOT . No change w/ valsalva. Mild conc LVH, Gr1 DD, mild MR, nl RV fxn, PASP .   Dysrhythmia    Fall at home    DUE TO DEHYDRATION-CARDIAC W/U NEGATIVE IN MARCH 2017 WITH DR FATH   Grade I diastolic dysfunction    Hyperlipidemia    Mild aortic regurgitation    Mild mitral regurgitation by prior echocardiogram    Moderate to severe pulmonary hypertension (HCC)    PAH (pulmonary artery hypertension) (HCC)    a. 12/2016 Echo: PASP .   RBBB (right bundle branch block)    Sinus tachycardia    a. 06/2015 Holter: Sinus tach. No significant arrhythmias.   SVT (supraventricular tachycardia) (HCC)    Tobacco abuse    Venous insufficiency    a. 12/2016 ABI wnl; b. 12/2016 Venous doppler: venous incompetence in R saphenous femoral jxn and the great saphenous vein. Venous incompetence on L. No DVT.    Past Surgical History:  Procedure Laterality Date   CARPAL TUNNEL RELEASE Right 01/18/2016   Procedure: CARPAL TUNNEL RELEASE AND GUYUNS CANAL RIGHT HAND;  Surgeon: Kennedy Bucker, MD;  Location: [facility];  Service: Orthopedics;  Laterality: Right;   CATARACT EXTRACTION W/PHACO Left 05/28/2023   Procedure: CATARACT EXTRACTION PHACO AND INTRAOCULAR LENS PLACEMENT (IOC) LEFT MALYUGIN DIABETIC 8.41, 00:40.5;  Surgeon: Lockie Mola, MD;  Location: [facility];  Service: Ophthalmology;  Laterality: Left;   JOINT REPLACEMENT Left 2003   PARTIAL KNEE   MUSCLE BIOPSY      Prior to Admission medications   Medication Sig Start Date End Date Taking? Authorizing Provider  aspirin EC 81 MG tablet Take 1 tablet (81 mg total) by mouth daily. 09/25/17  Yes Dunn, Raymon Mutton, PA-C  atorvastatin (LIPITOR) 40 MG tablet Take 1 tablet (40 mg total) by mouth daily. Due for follow up visit.  PLEASE CALL OFFICE TO SCHEDULE APPOINTMENT PRIOR TO NEXT REFILL 02/04/23  Yes Iran Ouch, MD  busPIRone (BUSPAR) 10 MG tablet Take 10 mg by mouth 2 (two) times daily. 07/17/20  Yes [provider]  CRANBERRY PO Take 1 tablet by mouth as needed.    Yes [provider]  docusate sodium (COLACE) 100 MG capsule Take 100 mg by mouth as needed for mild constipation.   Yes [provider]  DULoxetine (CYMBALTA) 30 MG capsule Take 60 mg by mouth daily. 12/05/16  Yes [provider]  fluticasone (FLONASE) 50 MCG/ACT nasal spray Place 2 sprays into both nostrils daily.   Yes [provider]  gabapentin (NEURONTIN) 100 MG capsule Take 100 mg by mouth 3 (three) times daily. 07/25/21  Yes [provider]  guaiFENesin-dextromethorphan (ROBITUSSIN DM) 100-10 MG/5ML syrup Take 5 mLs by mouth every 4 (four) hours as needed for cough. 10/26/21  Yes Marrion Coy,  MD  Homeopathic Products (ALLERGY MEDICINE PO) Take 1 tablet by mouth daily.   Yes [provider]  hydrochlorothiazide (MICROZIDE) 12.5 MG capsule Take 1 capsule (12.5 mg total) by mouth daily. 06/06/23 09/04/23 Yes Furth, Cadence H, PA-C  LORazepam (ATIVAN) 1 MG tablet TK 1 T PO QHS 12/05/17  Yes [provider]  metFORMIN (GLUCOPHAGE) 500 MG tablet Take 1,000 mg by mouth 2 (two) times daily with a meal.    Yes [provider]  methocarbamol (ROBAXIN) 500 MG tablet Take 500 mg by mouth. 1/2-1 po qHS prn 05/21/22  Yes [provider]  metoprolol tartrate  (LOPRESSOR) 50 MG tablet TAKE 2 TABLETS BY MOUTH TWICE DAILY. PLEASE CALL OFFICE TO SCHEDULE APPOINTMENT 03/10/23  Yes Iran Ouch, MD  metoprolol tartrate (LOPRESSOR) 50 MG tablet TAKE 2 TABLETS BY MOUTH TWICE DAILY. PLEASE CALL OFFICE TO SCHEDULE APPOINTMENT 06/04/23  Yes Iran Ouch, MD  ramelteon (ROZEREM) 8 MG tablet Take 8 mg by mouth at bedtime. 06/26/21  Yes [provider]    Allergies as of 05/16/2023 - Review Complete 05/31/2022  Allergen Reaction Noted   Azithromycin Other (See Comments) 12/30/2017   Erythromycin Nausea And Vomiting and Other (See Comments) 07/10/2015    Family History  Problem Relation Age of Onset   Heart disease Father     Social History   Socioeconomic History   Marital status: Divorced    Spouse name: Not on file   Number of children: Not on file   Years of education: Not on file   Highest education level: Not on file  Occupational History   Not on file  Tobacco Use   Smoking status: Some Days    Current packs/day: 0.75    Average packs/day: 0.8 packs/day for 52.0 years (39.0 ttl pk-yrs)    Types: Cigarettes   Smokeless tobacco: Never   Tobacco comments:    2 cigarettes in a month  Vaping Use   Vaping status: Never Used  Substance and Sexual Activity   Alcohol use: No    Comment: RARE   Drug use: No   Sexual activity: Never  Other Topics Concern   Not on file  Social History Narrative   Not on file   Social Drivers of Health   Financial Resource Strain: Not on file  Food Insecurity: Not on file  Transportation Needs: Not on file  Physical Activity: Not on file  Stress: Not on file  Social Connections: Not on file  Intimate Partner Violence: Not on file    Review of Systems: See HPI, otherwise negative ROS  Physical Exam: BP 109/70   Pulse 72   Temp 97.9 F (36.6 C) (Temporal)   Resp 16   Ht 5\' 5"  (1.651 m)   Wt 77.4 kg   SpO2 99%   BMI 28.39 kg/m  General:   Alert,  pleasant and cooperative in  NAD Head:  Normocephalic and atraumatic. Lungs:  Clear to auscultation.    Heart:  Regular rate and rhythm.   Impression/Plan: [name] is here for ophthalmic surgery.  Risks, benefits, limitations, and alternatives regarding ophthalmic surgery have been reviewed with the patient.  Questions have been answered.  All parties agreeable.   Lockie Mola, MD  06/11/2023, 7:39 AM

## 2023-06-11 NOTE — Anesthesia Preprocedure Evaluation (Signed)
Anesthesia Evaluation  Patient identified by MRN, date of birth, ID band Patient awake    Reviewed: Allergy & Precautions, H&P , NPO status , Patient's Chart, lab work & pertinent test results  Airway Mallampati: I  TM Distance: >3 FB Neck ROM: Full    Dental no notable dental hx.    Pulmonary neg pulmonary ROS, pneumonia, Current Smoker and Patient abstained from smoking.   Pulmonary exam normal breath sounds clear to auscultation       Cardiovascular + CAD and +CHF  negative cardio ROS Normal cardiovascular exam+ dysrhythmias  Rhythm:Regular Rate:Normal    Chart notes 05-31-22 Dana [name] is a 78 y.o. female with a hx of coronary artery calcification on CT, chronic diastolic heart failure with pulmonary hypertension, tobacco use, HTN, HLD, sinus tachycardia, venous insufficiency, Dm2, carpal tunnel disease presents today for 6 month follow-up.    She has chronic discoloration of her toes with normal ABI in the past. Noted venous insufficiency on venous doppler. Evaluated in 2018 for dyspnea with echo showing hyperdynamic LVEF with moderate to severe pulmonary hypertension. Her previous sinus tachycardia has improved with metoprolol. Previous Lexiscan 2019 was normal. Cardiac MRI with no evidence of infiltrative heart disease and EF 58%. Echo 12/2018 with normal LVEF, gr1DD, pulmonary pressures unable to be estimated.    She was seen 02/2019 by Dr. Kirke Corin. She was recommended for sleep study which she had completed and tells me it shows no sleep apnea.  At follow-up 03/14/2020 she noted feeling overall well though 2-week history of lightheadedness.  No improvement with Dramamine.  Lightheadedness is most notable with position changes.  EKG was without acute findings.  She was recommended for a ZIO monitor and echo.  ZIO monitor 03/14/2020 showing normal sinus rhythm with average heart rate of 67 bpm, one short run of SVT lasting 4  beats at rate of 118 bpm and rare PAC/PVC with burden of less than 1%. She did not have echocardiogram completed.   Seen 08/02/20 and had worsening fatigue and DOE. Echo was ordered May 2022 which showed EF 60-65%, Grade 1 DD and trivial mitral regurgitation.    The patient was seen 06/15/21 and reported worsening SOB and fatigue along with palpitations. Echo and heart monitor were ordered. Echo was unchanged. Heart monitor showed predominately NSR with BBB/IVCD, 1 run of SVT lasting 4 beats, rare PACs and PVCs.     Neuro/Psych  PSYCHIATRIC DISORDERS Anxiety Depression     Neuromuscular disease negative neurological ROS  negative psych ROS   GI/Hepatic negative GI ROS, Neg liver ROS,,,  Endo/Other  negative endocrine ROSdiabetes    Renal/GU Renal diseasenegative Renal ROS  negative genitourinary   Musculoskeletal negative musculoskeletal ROS (+) Arthritis ,    Abdominal   Peds negative pediatric ROS (+)  Hematology negative hematology ROS (+)   Anesthesia Other Findings   HCC) Fall at home Hyperlipidemia Tobacco abuse Venous insufficiency Dyspnea on exertion PAH (pulmonary artery hypertension) (HCC) Sinus tachycardia Coronary artery calcification seen on CT scan Diastolic dysfunction Dysrhythmia Moderate to severe pulmonary hypertension (HCC) RBBB (right bundle branch block) Mild mitral regurgitation by prior echocardiogram Mild aortic regurgitation Grade I diastolic dysfunction SVT (supraventricular tachycardia) (HCC)   Previous cataract surgery 05-28-23 had versed 1 mg IV and fentany 50 mcg IV CANNOT get hypoxic or hypercarbic!!!    Reproductive/Obstetrics negative OB ROS  Anesthesia Physical Anesthesia Plan  ASA: 4  Anesthesia Plan: MAC   Post-op Pain Management:    Induction: Intravenous  PONV Risk Score and Plan:   Airway Management Planned: Natural Airway and Nasal Cannula  Additional Equipment:    Intra-op Plan:   Post-operative Plan:   Informed Consent: I have reviewed the patients History and Physical, chart, labs and discussed the procedure including the risks, benefits and alternatives for the proposed anesthesia with the patient or authorized representative who has indicated his/her understanding and acceptance.     Dental Advisory Given  Plan Discussed with: Anesthesiologist, CRNA and Surgeon  Anesthesia Plan Comments: (Patient consented for risks of anesthesia including but not limited to:  - adverse reactions to medications - damage to eyes, teeth, lips or other oral mucosa - nerve damage due to positioning  - sore throat or hoarseness - Damage to heart, brain, nerves, lungs, other parts of body or loss of life  Patient voiced understanding and assent.)         Anesthesia Quick Evaluation

## 2023-06-11 NOTE — Anesthesia Postprocedure Evaluation (Signed)
Anesthesia Post Note  Patient: [name]  Procedure(s) Performed: CATARACT EXTRACTION PHACO AND INTRAOCULAR LENS PLACEMENT (IOC) RIGHT DIABETIC 6.34 00:36.3 (Right: Eye)  Patient location during evaluation: PACU Anesthesia Type: MAC Level of consciousness: awake and alert Pain management: pain level controlled Vital Signs Assessment: post-procedure vital signs reviewed and stable Respiratory status: spontaneous breathing, nonlabored ventilation, respiratory function stable and patient connected to nasal cannula oxygen Cardiovascular status: stable and blood pressure returned to baseline Postop Assessment: no apparent nausea or vomiting Anesthetic complications: no   No notable events documented.   Last Vitals:  Vitals:   06/11/23 0803 06/11/23 0806  BP: 116/66 109/68  Pulse:  73  Resp: 16   Temp:    SpO2:  98%    Last Pain:  Vitals:   06/11/23 0806  TempSrc:   PainSc: 0-No pain                 Marisue Humble

## 2023-06-12 ENCOUNTER — Encounter: Payer: Self-pay | Admitting: Ophthalmology

## 2023-06-25 ENCOUNTER — Ambulatory Visit: Payer: PPO | Attending: Medical

## 2023-06-25 DIAGNOSIS — R600 Localized edema: Secondary | ICD-10-CM

## 2023-06-25 DIAGNOSIS — I5032 Chronic diastolic (congestive) heart failure: Secondary | ICD-10-CM | POA: Diagnosis not present

## 2023-06-25 LAB — ECHOCARDIOGRAM COMPLETE
AR max vel: 2.1 cm2
AV Area VTI: 2.17 cm2
AV Area mean vel: 2.06 cm2
AV Mean grad: 3 mmHg
AV Peak grad: 5.6 mmHg
Ao pk vel: 1.18 m/s
Area-P 1/2: 3.65 cm2
Calc EF: 58 %
Single Plane A2C EF: 59.2 %
Single Plane A4C EF: 56.1 %

## 2023-06-26 ENCOUNTER — Other Ambulatory Visit: Payer: Self-pay

## 2023-06-26 DIAGNOSIS — I3139 Other pericardial effusion (noninflammatory): Secondary | ICD-10-CM

## 2023-06-26 LAB — BASIC METABOLIC PANEL
BUN/Creatinine Ratio: 28 (ref 12–28)
BUN: 34 mg/dL — ABNORMAL HIGH (ref 8–27)
CO2: 21 mmol/L (ref 20–29)
Calcium: 10.3 mg/dL (ref 8.7–10.3)
Chloride: 102 mmol/L (ref 96–106)
Creatinine, Ser: 1.22 mg/dL — ABNORMAL HIGH (ref 0.57–1.00)
Glucose: 188 mg/dL — ABNORMAL HIGH (ref 70–99)
Potassium: 5.1 mmol/L (ref 3.5–5.2)
Sodium: 143 mmol/L (ref 134–144)
eGFR: 46 mL/min/{1.73_m2} — ABNORMAL LOW (ref >59)

## 2023-07-07 ENCOUNTER — Other Ambulatory Visit: Payer: Self-pay | Admitting: Cardiovascular Disease

## 2023-07-11 ENCOUNTER — Ambulatory Visit: Payer: PPO | Admitting: Medical

## 2023-08-07 ENCOUNTER — Ambulatory Visit: Admitting: Medical

## 2023-11-17 ENCOUNTER — Other Ambulatory Visit: Payer: Self-pay

## 2023-11-17 ENCOUNTER — Telehealth: Payer: Self-pay | Admitting: Medical

## 2023-11-17 MED ORDER — METOPROLOL TARTRATE 50 MG PO TABS: 100.0000 mg | ORAL_TABLET | Freq: Two times a day (BID) | ORAL | 3 refills | Status: DC

## 2023-11-17 NOTE — Telephone Encounter (Signed)
*  STAT* If patient is at the pharmacy, call can be transferred to refill team.   1. Which medications need to be refilled? (please list name of each medication and dose if known)   metoprolol  tartrate (LOPRESSOR ) 50 MG tablet    2. Which pharmacy/location (including street and city if local pharmacy) is medication to be sent to? [id number] [address], [place] - [address] AT [facility] [address] Phone: [phone]  Fax: [phone]     3. Do they need a 30 day or 90 day supply? Pt has made appt for 12/02/23 please refill until then. Pt knows she needs to keep appt for future refills  Pt is out of medication

## 2023-12-02 ENCOUNTER — Ambulatory Visit: Admitting: Medical

## 2023-12-17 ENCOUNTER — Other Ambulatory Visit: Payer: Self-pay | Admitting: Gastroenterology

## 2023-12-17 DIAGNOSIS — R140 Abdominal distension (gaseous): Secondary | ICD-10-CM

## 2023-12-17 DIAGNOSIS — R194 Change in bowel habit: Secondary | ICD-10-CM

## 2023-12-17 DIAGNOSIS — R197 Diarrhea, unspecified: Secondary | ICD-10-CM

## 2023-12-17 DIAGNOSIS — R1013 Epigastric pain: Secondary | ICD-10-CM

## 2023-12-17 DIAGNOSIS — R634 Abnormal weight loss: Secondary | ICD-10-CM

## 2023-12-17 DIAGNOSIS — R109 Unspecified abdominal pain: Secondary | ICD-10-CM

## 2023-12-25 ENCOUNTER — Ambulatory Visit: Payer: PPO | Attending: Medical

## 2023-12-30 NOTE — Progress Notes (Signed)
 CC: Preventative Health Exam  HPI Verbally consented to the use of AI for note-taking.   Dana [name] is a 78 y.o. here for preventative health exam  Preventative health exam: No acute issues.  Chronic medical issues stable and tolerating medications without adverse effects.  Does not currently exercise regularly and not trying to follow any specific diet.  No exertional cp or syncopal episodes.  No vaginal symptoms, urinary issues, or rectal pain/bleeding.  Denies any tobacco use.    History of Present Illness Dana [name] is a 78 year old female with depression and diabetes who presents for medication management and her annual physical  She is currently taking Cymbalta  (duloxetine ) 30 mg once daily for depression, which has been effective in reducing crying episodes. She previously took it twice daily for many years and wants to return to this regimen as it was more effective. A recent move to a new residence has positively impacted her mental health.  She has diabetes and is on metformin 500 mg, two tablets in the morning and two at night, totaling 1000 mg daily. Her recent A1c was 5.9, an improvement from a previous level over 7. She is also taking pioglitazone. She regularly monitors her blood sugar levels.  She has a history of diastolic heart failure and is scheduled for a colonoscopy next month due to gastrointestinal issues. She has difficulty increasing her fluid intake, consuming only about 16 ounces daily, which she attributes to her GI problems and depression. This has led to symptoms of dehydration such as lightheadedness and headaches.  Her LDL cholesterol is low, and she has no history of heart attack or stroke. No side effects from her current cholesterol medication.  In terms of her social history, she does not engage in regular exercise and does not consider her diet to be healthy. She wears her seatbelt regularly and has no history of abnormal Pap smears. She had  cataract surgery earlier this year and has not visited a dentist in a couple of years. No chest pain on exertion, passing out episodes, vaginal symptoms, urinary symptoms, blood in stools, night sweats, or abnormal weight loss. Occasional swelling in the left foot.   ROS Review of systems is unremarkable for any active cardiac, respiratory, GI, GU, hematologic, neurologic, dermatologic, HEENT, or psychiatric symptoms except as noted above.  No fevers, chills, or constitutional symptoms.   Current Outpatient Medications  Medication Sig Dispense Refill  . aspirin  81 MG chewable tablet Take 81 mg by mouth once daily    . atorvastatin  (LIPITOR) 40 MG tablet Take one tab in the pm    . DULoxetine  (CYMBALTA ) 30 MG DR capsule Take 1 capsule (30 mg total) by mouth once daily 30 capsule 11  . hydrOXYzine (ATARAX) 25 MG tablet Take 1 tablet (25 mg total) by mouth 3 (three) times daily as needed for Itching 30 tablet 0  . metFORMIN (GLUCOPHAGE) 500 MG tablet Take 500 mg by mouth 2 (two) times daily with meals. Take 2 tabs in the am and 2 tabs in the pm    . metoprolol  tartrate (LOPRESSOR ) 50 MG tablet Take 50 mg by mouth 2 (two) times daily    . ondansetron  (ZOFRAN -ODT) 4 MG disintegrating tablet Take 1 tablet (4 mg total) by mouth every 8 (eight) hours as needed for Nausea for up to 60 days 45 tablet 1  . pioglitazone (ACTOS) 30 MG tablet TAKE 1 TABLET BY MOUTH EVERY DAILY    . sodium, potassium, and magnesium (SUPREP)  oral solution Take 1 Bottle by mouth as directed One kit contains 2 bottles.  Take both bottles at the times instructed by your provider. 354 mL 0  . traZODone (DESYREL) 50 MG tablet Take 1 tablet (50 mg total) by mouth at bedtime 90 tablet 1  . blood glucose diagnostic test strip 1 each (1 strip total) 3 (three) times daily Use as instructed. (Patient not taking: Reported on 12/30/2023) 100 each 12  . blood glucose meter kit as directed (Patient not taking: Reported on 12/30/2023) 1 each 0  .  fluticasone propionate (FLONASE) 50 mcg/actuation nasal spray Place 2 sprays into one nostril once daily (Patient not taking: Reported on 12/30/2023)    . hydroCHLOROthiazide  (MICROZIDE ) 12.5 mg capsule Take 12.5 mg by mouth once daily    . lancets (2-IN-1 LANCET DEVICE) Use 1 each 3 (three) times daily Use as instructed. (Patient not taking: Reported on 12/30/2023) 100 each 12  . lancing device with lancets kit Use 1 each 3 (three) times daily Use as instructed. (Patient not taking: Reported on 12/30/2023) 100 each 12   No current facility-administered medications for this visit.    Allergies as of 12/30/2023 - Reviewed 12/30/2023  Allergen Reaction Noted  . Azithromycin  Other (See Comments) 12/30/2017  . Erythromycin Unknown, Nausea And Vomiting, and Other (See Comments) 07/10/2015    Patient Active Problem List  Diagnosis  . DDD (degenerative disc disease), lumbar-  followed by Dr. Avanell  . Lumbar radiculitis- followed by Dr. Avanell  . Lumbar stenosis with neurogenic claudication - followed by Dr. Avanell  . Status post left partial knee replacement  . Left knee pain  . Chronic diastolic CHF (congestive heart failure) (CMS/HHS-HCC)- Followed by [facility] cardiology  . Diabetes mellitus without complication (CMS/HHS-HCC)  . HLD (hyperlipidemia)  . Tobacco abuse  . CAD (coronary artery disease)  . Major depressive disorder  . Generalized anxiety disorder  . Medicare annual wellness visit, initial 11/19/23    Past Medical History:  Diagnosis Date  . Depression   . Diabetes mellitus type 2, uncomplicated (CMS/HHS-HCC)   . Hyperlipidemia   . Migraine headache   . Osteoarthritis   . Osteoporosis, post-menopausal     Past Surgical History:  Procedure Laterality Date  . Carpal tunnel release and Gutuns canal right hand Right 01/18/2016   Dr.Menz  . JOINT REPLACEMENT    . MUSCLE BIOPSY    . REPLACEMENT UNICONDYLAR JOINT KNEE Left    Knee    Family History  Problem Relation  Name Age of Onset  . Diabetes type II Father  51  . Diabetes Father    . Diabetes type II Sister  51  . Kidney disease Sister    . Clotting disorder Paternal Grandmother      Social History   Socioeconomic History  . Marital status: Divorced  Tobacco Use  . Smoking status: Some Days    Current packs/day: 0.25    Types: Cigarettes  . Smokeless tobacco: Current  Vaping Use  . Vaping status: Never Used  Substance and Sexual Activity  . Alcohol use: No    Alcohol/week: 1.0 standard drink of alcohol    Types: 1 Standard drinks or equivalent per week    Comment: socially  . Drug use: No  . Sexual activity: Never   Social Drivers of Corporate investment banker Strain: Low Risk  (12/30/2023)   Overall Financial Resource Strain (CARDIA)   . Difficulty of Paying Living Expenses: Not hard at all  Food Insecurity: No Food Insecurity (12/30/2023)   Hunger Vital Sign   . Worried About Programme researcher, broadcasting/film/video in the Last Year: Never true   . Ran Out of Food in the Last Year: Never true  Transportation Needs: No Transportation Needs (12/30/2023)   PRAPARE - Transportation   . Lack of Transportation (Medical): No   . Lack of Transportation (Non-Medical): No  Housing Stability: Low Risk  (12/30/2023)   Housing Stability Vital Sign   . Unable to Pay for Housing in the Last Year: No   . Number of Times Moved in the Last Year: 1   . Homeless in the Last Year: No    Health Maintenance  Topic Date Due  . DXA Bone Density Scan  Never done  . Colorectal Cancer Screening  Never done  . Annual Physical/Well Child Check  Never done  . Adult Tetanus (Td And Tdap)  Never done  . Pneumococcal Vaccine: 50+ (1 of 2 - PCV) Never done  . RSV Immunization Pregnant or 60+ (1 - 1-dose 75+ series) Never done  . Influenza Vaccine (1) 12/29/2023  . Medicare Subsequent AWV [id number]  11/19/2024  . Lipid Panel  12/22/2024  . Depression Screening  12/29/2024  . Hib Vaccines  Aged Out  . Hepatitis A Vaccines  Aged  Out  . Meningococcal B Vaccine  Aged Out  . Meningococcal ACWY Vaccine  Aged Out  . HPV Vaccines  Aged Out  . COVID-19 Vaccine  Discontinued  . Shingrix  Discontinued  . Hepatitis C Screen  Discontinued    Health Habits:  Dental Exam: 2022 Eye Exam: 2025 Skin Exam: many years go Exercise: 0 times/week on average  Current exercise activities: no regular exercise  Diet: No specific diet Seat belt use:Yes  GYN: Sexual Health  Menstrual status: absent  LMP: absent Menses: absent  Last pap smear: see HM section  History of abnormal pap smears: No   PHQ 2/9 from today's flowsheet  PHQ-2 PHQ-2 Over the last 2 weeks, how often have you been bothered by any of the following problems? Little interest or pleasure in doing things: Not at all Feeling down, depressed, or hopeless: Several days Patient Health Questionnaire-2 Score: 1  PHQ-9 (if PHQ >=3) PHQ-9 Over the last 2 weeks, how often have you been bothered by any of the following problems? How difficult have these problems made it for you to do your work, take care of things at home, or get along with other people?: (Patient-Rptd) Very difficult  Depression Severity and Treatment Recommendations:  0-4= None  5-9= Mild / Treatment: Support, educate to call if worse; return in one month  10-14= Moderate / Treatment: Support, watchful waiting; Antidepressant or Psychotherapy  15-19= Moderately severe / Treatment: Antidepressant OR Psychotherapy  >= 20 = Major depression, severe / Antidepressant AND Psychotherapy  A total of 15 minutes was spent completing the PHQ questionnaire and counseling patient.   Vitals:   12/30/23 1409  BP: 122/68  Pulse: 77  SpO2: 95%  Weight: 67.6 kg (149 lb)  Height: 167.6 cm (5' 6)   Body mass index is 24.05 kg/m.  Exam  General: Alert oriented x3  Skin: No suspicious lesions or moles.   Eyes: Sclera and conjunctiva clear; pupils equal round and reactive to light and accommodation;  extraocular movements intact Ears: External ears and canal normal; tympanic membranes normal.   Nose: Mucosa healthy without drainage or ulceration Oropharynx: No suspicious lesions Neck: No swelling, masses, stiffness, pain,  limited movement, carotid pulses normal bilaterally, thyroid  normal size, no masses palpated. No bruits heard. Lungs: Respirations unlabored; clear to auscultation bilaterally Back: No spinal deformity Cardiovascular: Heart regular rate and rhythm without murmurs, gallops, or rubs Abdomen: Soft; non tender; non distended;  no masses or organomegaly Lymph Nodes: No significant cervical, supraclavicular, axillary or inguinal lymphadenopathy noted Musculoskeletal: No active joint inflammation Extremities: Normal, no edema Pulses: Dorsalis pedis palpable and symmetric bilaterally Neurologic: Alert and oriented; speech intact; face symmetrical; moves all extremities well Appointment on 12/23/2023  Component Date Value Ref Range Status  . WBC (White Blood Cell Count) 12/23/2023 9.8  4.1 - 10.2 10^3/uL Final  . RBC (Red Blood Cell Count) 12/23/2023 3.92 (L)  4.04 - 5.48 10^6/uL Final  . Hemoglobin 12/23/2023 12.4  12.0 - 15.0 gm/dL Final  . Hematocrit 91/73/7974 37.8  35.0 - 47.0 % Final  . MCV (Mean Corpuscular Volume) 12/23/2023 96.4  80.0 - 100.0 fl Final  . MCH (Mean Corpuscular Hemoglobin) 12/23/2023 31.6 (H)  27.0 - 31.2 pg Final  . MCHC (Mean Corpuscular Hemoglobin * 12/23/2023 32.8  32.0 - 36.0 gm/dL Final  . Platelet Count 12/23/2023 242  150 - 450 10^3/uL Final  . RDW-CV (Red Cell Distribution Widt* 12/23/2023 13.4  11.6 - 14.8 % Final  . MPV (Mean Platelet Volume) 12/23/2023 10.2  9.4 - 12.4 fl Final  . Neutrophils 12/23/2023 5.31  1.50 - 7.80 10^3/uL Final  . Lymphocytes 12/23/2023 3.34  1.00 - 3.60 10^3/uL Final  . Monocytes 12/23/2023 0.48  0.00 - 1.50 10^3/uL Final  . Eosinophils 12/23/2023 0.56 (H)  0.00 - 0.55 10^3/uL Final  . Basophils 12/23/2023 0.05   0.00 - 0.09 10^3/uL Final  . Neutrophil % 12/23/2023 54.3  32.0 - 70.0 % Final  . Lymphocyte % 12/23/2023 34.2  10.0 - 50.0 % Final  . Monocyte % 12/23/2023 4.9  4.0 - 13.0 % Final  . Eosinophil % 12/23/2023 5.7 (H)  1.0 - 5.0 % Final  . Basophil% 12/23/2023 0.5  0.0 - 2.0 % Final  . Immature Granulocyte % 12/23/2023 0.4  <=0.7 % Final  . Immature Granulocyte Count 12/23/2023 0.04  <=0.06 10^3/L Final  . Glucose 12/23/2023 113 (H)  70 - 110 mg/dL Final  . Sodium 91/73/7974 140  136 - 145 mmol/L Final  . Potassium 12/23/2023 3.8  3.6 - 5.1 mmol/L Final  . Chloride 12/23/2023 98  97 - 109 mmol/L Final  . Carbon Dioxide (CO2) 12/23/2023 28.8  22.0 - 32.0 mmol/L Final  . Urea Nitrogen (BUN) 12/23/2023 23  7 - 25 mg/dL Final  . Creatinine 91/73/7974 1.0  0.6 - 1.1 mg/dL Final  . Glomerular Filtration Rate (eGFR) 12/23/2023 58 (L)  >60 mL/min/1.73sq m Final  . Calcium  12/23/2023 9.8  8.7 - 10.3 mg/dL Final  . AST  91/73/7974 18  8 - 39 U/L Final  . ALT  12/23/2023 15  5 - 38 U/L Final  . Alk Phos (alkaline Phosphatase) 12/23/2023 44  34 - 104 U/L Final  . Albumin 12/23/2023 4.1  3.5 - 4.8 g/dL Final  . Bilirubin, Total 12/23/2023 0.8  0.3 - 1.2 mg/dL Final  . Protein, Total 12/23/2023 6.5  6.1 - 7.9 g/dL Final  . A/G Ratio 91/73/7974 1.7  1.0 - 5.0 gm/dL Final  . Cholesterol, Total 12/23/2023 109  100 - 200 mg/dL Final  . Triglyceride 91/73/7974 157  35 - 199 mg/dL Final  . HDL (High Density Lipoprotein) Cho* 12/23/2023 41.5  35.0 -  85.0 mg/dL Final  . LDL Calculated 12/23/2023 36  0 - 130 mg/dL Final  . VLDL Cholesterol 12/23/2023 31  mg/dL Final  . Cholesterol/HDL Ratio 12/23/2023 2.6   Final  . Hemoglobin A1C 12/23/2023 5.9 (H)  4.2 - 5.6 % Final  . Average Blood Glucose (Calc) 12/23/2023 123  mg/dL Final  Initial consult on 12/16/2023  Component Date Value Ref Range Status  . Campylobacter - [facility] 12/18/2023 Not Detected  Not Detected Final  . C Difficile Toxin A/B - [facility]  12/18/2023 Not Detected  Not Detected Final  . Plesiomonas Shigelloides - [facility] 12/18/2023 Not Detected  Not Detected Final  . Salmonella - [facility] 12/18/2023 Not Detected  Not Detected Final  . Vibrio - [facility] 12/18/2023 Not Detected  Not Detected Final  . Vibrio Cholerae - [facility] 12/18/2023 Not Detected  Not Detected Final  . Yersinia enterocolitica - [facility] 12/18/2023 Not Detected  Not Detected Final  . Enteroaggregative E Coli - [facility] 12/18/2023 Not Detected  Not Detected Final  . Enteropathogenic E Coli - [facility] 12/18/2023 Not Detected  Not Detected Final  . Enterotoxigenic E Coli - [facility] 12/18/2023 Not Detected  Not Detected Final  . Shiga-Toxin-Producing E Coli - Lab* 12/18/2023 Not Detected  Not Detected Final  . E Coli 0157 - [facility] 12/18/2023 Not applicable  Not Detected Final  . Shigella/Enteroinvasive E Coli - L* 12/18/2023 Not Detected  Not Detected Final  . Cryptosporidium - [facility] 12/18/2023 Not Detected  Not Detected Final  . Cyclospora Cayetanesis - [facility] 12/18/2023 Not Detected  Not Detected Final  . Entamoeba Histolytica - [facility] 12/18/2023 Not Detected  Not Detected Final  . Giardia Lamblia - [facility] 12/18/2023 Not Detected  Not Detected Final  . Adenovirus F 40/41 - [facility] 12/18/2023 Not Detected  Not Detected Final  . Astrovirus - [facility] 12/18/2023 Not Detected  Not Detected Final  . Norovirus GI/GII - [facility] 12/18/2023 Not Detected  Not Detected Final  . Rotavirus A - [facility] 12/18/2023 Not Detected  Not Detected Final  . Sapovirus - [facility] 12/18/2023 Not Detected  Not Detected Final  . WBC (White Blood Cell Count) 12/16/2023 10.5 (H)  4.1 - 10.2 10^3/uL Final  . RBC (Red Blood Cell Count) 12/16/2023 3.97 (L)  4.04 - 5.48 10^6/uL Final  . Hemoglobin 12/16/2023 12.5  12.0 - 15.0 gm/dL Final  . Hematocrit 91/80/7974 38.2  35.0 - 47.0 % Final  . MCV (Mean Corpuscular Volume) 12/16/2023 96.2  80.0 - 100.0 fl Final  . MCH (Mean Corpuscular Hemoglobin)  12/16/2023 31.5 (H)  27.0 - 31.2 pg Final  . MCHC (Mean Corpuscular Hemoglobin * 12/16/2023 32.7  32.0 - 36.0 gm/dL Final  . Platelet Count 12/16/2023 246  150 - 450 10^3/uL Final  . RDW-CV (Red Cell Distribution Widt* 12/16/2023 13.2  11.6 - 14.8 % Final  . MPV (Mean Platelet Volume) 12/16/2023 10.5  9.4 - 12.4 fl Final  . Neutrophils 12/16/2023 6.55  1.50 - 7.80 10^3/uL Final  . Lymphocytes 12/16/2023 2.68  1.00 - 3.60 10^3/uL Final  . Monocytes 12/16/2023 0.54  0.00 - 1.50 10^3/uL Final  . Eosinophils 12/16/2023 0.64 (H)  0.00 - 0.55 10^3/uL Final  . Basophils 12/16/2023 0.07  0.00 - 0.09 10^3/uL Final  . Neutrophil % 12/16/2023 62.1  32.0 - 70.0 % Final  . Lymphocyte % 12/16/2023 25.5  10.0 - 50.0 % Final  . Monocyte % 12/16/2023 5.1  4.0 - 13.0 % Final  . Eosinophil % 12/16/2023 6.1 (H)  1.0 -  5.0 % Final  . Basophil% 12/16/2023 0.7  0.0 - 2.0 % Final  . Immature Granulocyte % 12/16/2023 0.5  <=0.7 % Final  . Immature Granulocyte Count 12/16/2023 0.05  <=0.06 10^3/L Final  . Glucose 12/16/2023 116 (H)  70 - 110 mg/dL Final  . Sodium 91/80/7974 141  136 - 145 mmol/L Final  . Potassium 12/16/2023 4.6  3.6 - 5.1 mmol/L Final  . Chloride 12/16/2023 98  97 - 109 mmol/L Final  . Carbon Dioxide (CO2) 12/16/2023 32.6 (H)  22.0 - 32.0 mmol/L Final  . Urea Nitrogen (BUN) 12/16/2023 18  7 - 25 mg/dL Final  . Creatinine 91/80/7974 1.0  0.6 - 1.1 mg/dL Final  . Glomerular Filtration Rate (eGFR) 12/16/2023 58 (L)  >60 mL/min/1.73sq m Final  . Calcium  12/16/2023 10.0  8.7 - 10.3 mg/dL Final  . AST  91/80/7974 18  8 - 39 U/L Final  . ALT  12/16/2023 12  5 - 38 U/L Final  . Alk Phos (alkaline Phosphatase) 12/16/2023 45  34 - 104 U/L Final  . Albumin 12/16/2023 4.3  3.5 - 4.8 g/dL Final  . Bilirubin, Total 12/16/2023 0.8  0.3 - 1.2 mg/dL Final  . Protein, Total 12/16/2023 6.7  6.1 - 7.9 g/dL Final  . A/G Ratio 91/80/7974 1.8  1.0 - 5.0 gm/dL Final  . C Reactive Protein - [facility] 12/16/2023 <1  0  - 10 mg/L Final  . Endomysial Antibody IgA - [facility] 12/16/2023 Negative  Negative Final  . t-Transglutaminase (tTG) IgA - [facility]* 12/16/2023 <2  0 - 3 U/mL Final  . Immunoglobulin A, Qn, Serum - [facility] 12/16/2023 102  64 - 422 mg/dL Final    Assessment and Plan  Preventative health exam - Stable exam.   - CV screening labs reviewed with patient.  CBC wnls.   - Up to date on mammogram, pap smear, and colonoscopy.   - Vaccinations reviewed.   - Counseled on nutrition modification and exercise.  Assessment & Plan Depression and Anxiety Managed with duloxetine ; improvement noted but further enhancement needed. Hydroxyzine ineffective for acute anxiety. - Increase duloxetine  to 30 mg twice daily. - Follow up in 6 weeks to assess symptom control with increased duloxetine . - Consider alternative anxiety management if duloxetine  does not adequately control symptoms.  Type 2 Diabetes Mellitus Well-controlled with A1c of 5.9. Pioglitazone not preferred due to heart failure. - Discontinue pioglitazone due to association with heart failure. - Continue metformin 1000 mg twice daily. - Monitor blood glucose levels; if fasting glucose is consistently above 130 mg/dL or postprandial glucose is above 180 mg/dL, contact the provider. - Follow up in 4-6 weeks to reassess blood glucose control.  Chronic Diastolic Heart Failure - Discontinue pioglitazone.  Coronary Artery Disease Well-managed with low LDL cholesterol and no atorvastatin  side effects. - Continue atorvastatin  40 mg daily. - Monitor cholesterol levels.  Dehydration (suspected) Suspected due to low fluid intake, dizziness, and headaches. Possible gastrointestinal and depression-related causes. - Encourage increased fluid intake. - Evaluate for structural causes of dysphagia during upcoming GI evaluation.  General Health Maintenance Up to date on RSV and pneumonia vaccines. Tetanus status unknown. - Recommend tetanus booster if not  received in the past 10 years.  Follow-Up Monitor effects of medication changes and address new symptoms. - Follow up in 4-6 weeks to assess the effects of increased duloxetine  and discontinuation of pioglitazone. - Contact provider if blood glucose levels are consistently high.    Goals Addressed  This Visit's Progress   . Maintain health/healthy lifestyle   On track      Follow up: FU in about 6 weeks for medication management   This note has been created using automated tools and reviewed for accuracy by provider.  Attestation Statement:   I personally performed the service, non-incident to. (WP)   MASON MCCLELLAND MINOR, PA

## 2024-01-06 ENCOUNTER — Ambulatory Visit
Admission: RE | Admit: 2024-01-06 | Discharge: 2024-01-06 | Disposition: A | Discharge status: Home / Self Care | Source: Ambulatory Visit | Attending: Gastroenterology | Admitting: Gastroenterology

## 2024-01-06 DIAGNOSIS — R194 Change in bowel habit: Secondary | ICD-10-CM | POA: Insufficient documentation

## 2024-01-06 DIAGNOSIS — R140 Abdominal distension (gaseous): Secondary | ICD-10-CM | POA: Insufficient documentation

## 2024-01-06 DIAGNOSIS — R109 Unspecified abdominal pain: Secondary | ICD-10-CM | POA: Diagnosis present

## 2024-01-06 DIAGNOSIS — R1013 Epigastric pain: Secondary | ICD-10-CM | POA: Insufficient documentation

## 2024-01-06 DIAGNOSIS — R197 Diarrhea, unspecified: Secondary | ICD-10-CM | POA: Diagnosis present

## 2024-01-06 DIAGNOSIS — R634 Abnormal weight loss: Secondary | ICD-10-CM | POA: Insufficient documentation

## 2024-01-06 MED ORDER — IOHEXOL 300 MG/ML  SOLN
100.0000 mL | Freq: Once | INTRAMUSCULAR | Status: AC | PRN
  Administered 2024-01-06: 100 mL via INTRAVENOUS

## 2024-01-08 ENCOUNTER — Ambulatory Visit: Attending: Medical | Admitting: Medical

## 2024-01-08 ENCOUNTER — Encounter: Payer: Self-pay | Admitting: Medical

## 2024-01-08 VITALS — BP 118/70 | HR 70 | Ht 66.0 in | Wt 148.4 lb

## 2024-01-08 DIAGNOSIS — E782 Mixed hyperlipidemia: Secondary | ICD-10-CM

## 2024-01-08 DIAGNOSIS — I2510 Atherosclerotic heart disease of native coronary artery without angina pectoris: Secondary | ICD-10-CM

## 2024-01-08 DIAGNOSIS — R197 Diarrhea, unspecified: Secondary | ICD-10-CM

## 2024-01-08 DIAGNOSIS — Z720 Tobacco use: Secondary | ICD-10-CM

## 2024-01-08 DIAGNOSIS — Z01810 Encounter for preprocedural cardiovascular examination: Secondary | ICD-10-CM

## 2024-01-08 DIAGNOSIS — I5032 Chronic diastolic (congestive) heart failure: Secondary | ICD-10-CM

## 2024-01-08 DIAGNOSIS — I3139 Other pericardial effusion (noninflammatory): Secondary | ICD-10-CM

## 2024-01-08 NOTE — Progress Notes (Signed)
 Cardiology Office Note   Date:  01/08/2024  ID:  KADI HESSION, DOB 1945/10/22, MRN [id number] PCP: Minor, Jonette, PA  [facility] Providers Cardiologist:  Deatrice Cage, MD   History of Present Illness Dana [name] is a 78 y.o. female with a h/o coronary artery calcification on CT, chronic diastolic heart failure with pulmonary hypertension, tobacco use, HTN, HLD, sinus tachycardia, venous insufficiency, DM2, carpal tunnel disease presents today for 6 month follow-up.    She has chronic discoloration of her toes with normal ABI in the past. Noted venous insufficiency on venous doppler. Evaluated in 2018 for dyspnea with echo showing hyperdynamic LVEF with moderate to severe pulmonary hypertension. Her previous sinus tachycardia has improved with metoprolol . Previous Lexiscan  2019 was normal. Cardiac MRI with no evidence of infiltrative heart disease and EF 58%. Echo 12/2018 with normal LVEF, gr1DD, pulmonary pressures unable to be estimated.    She was seen 02/2019 by Dr. Cage. She was recommended for sleep study which she had completed and tells me it shows no sleep apnea.  At follow-up 03/14/2020 she noted feeling overall well though 2-week history of lightheadedness.  No improvement with Dramamine.  Lightheadedness is most notable with position changes.  EKG was without acute findings.  She was recommended for a ZIO monitor and echo.  ZIO monitor 03/14/2020 showing normal sinus rhythm with average heart rate of 67 bpm, one short run of SVT lasting 4 beats at rate of 118 bpm and rare PAC/PVC with burden of less than 1%. She did not have echocardiogram completed.   Seen 08/02/20 and had worsening fatigue and DOE. Echo was ordered May 2022 which showed EF 60-65%, Grade 1 DD and trivial mitral regurgitation.    The patient was seen 06/15/21 and reported worsening SOB and fatigue along with palpitations. Echo and heart monitor were ordered. Echo was unchanged. Heart monitor  showed predominately NSR with BBB/IVCD, 1 run of SVT lasting 4 beats, rare PACs and PVC  Patient was last seen in February 2025 and was overall doing well from a cardiac perspective.  Today, the patient has been doing OK from a cardiac perspective. She has been having major GI issues/diarrhea since the last visit. She plans on having GI procedures in the near future. She gets SOB when she walks. Due to GI issues/diarrhea she is dehydrated, weak and shaky. She feels tired all the time. She has lost 25lbs. She also has stressors from home. Patient is smoking occasionally. She denies lower leg edema.   She reports she will have colonoscopy and EGD at the end of October.   Studies Reviewed      Echo 05/2023  1. Left ventricular ejection fraction, by estimation, is 60 to 65%. The  left ventricle has normal function. The left ventricle has no regional  wall motion abnormalities. Left ventricular diastolic parameters are  consistent with Grade I diastolic  dysfunction (impaired relaxation).   2. Right ventricular systolic function is normal. The right ventricular  size is normal. There is normal pulmonary artery systolic pressure.   3. A small pericardial effusion is present. The pericardial effusion is  circumferential. There is no evidence of cardiac tamponade.   4. The mitral valve is normal in structure. Mild mitral valve  regurgitation. No evidence of mitral stenosis.   5. The aortic valve is normal in structure. Aortic valve regurgitation is  not visualized. No aortic stenosis is present.   6. The inferior vena cava is normal in size with greater  than 50%  respiratory variability, suggesting right atrial pressure of 3 mmHg.   Echo 06/2021  1. Left ventricular ejection fraction, by estimation, is 60 to 65%. The  left ventricle has normal function. The left ventricle has no regional  wall motion abnormalities. There is mild left ventricular hypertrophy.  Left ventricular diastolic  parameters  are consistent with Grade I diastolic dysfunction (impaired relaxation).   2. Right ventricular systolic function is normal. The right ventricular  size is normal. Mildly increased right ventricular wall thickness.  Tricuspid regurgitation signal is inadequate for assessing PA pressure.   3. The mitral valve is normal in structure. Mild mitral valve  regurgitation. No evidence of mitral stenosis.   4. The aortic valve was not well visualized. Aortic valve regurgitation  is mild. No aortic stenosis is present.   5. The inferior vena cava is normal in size with greater than 50%  respiratory variability, suggesting right atrial pressure of 3 mmHg.   Heart monitor 06/2021 Study Highlights   Patch Wear Time:  13 days and 7 hours (2023-02-24T05:13:44-0500 to 2023-03-09T12:45:34-0500)   Patient had a min HR of 68 bpm, max HR of 119 bpm, and avg HR of 82 bpm.  Predominant underlying rhythm was Sinus Rhythm. Bundle Branch Block/IVCD was present.  1 run of Supraventricular Tachycardia occurred lasting 4 beats with a max rate of 119 bpm (avg 117 bpm). Rare PACs and rare PVCs.  Physical Exam VS:  BP 118/70   Pulse 70   Ht 5' 6 (1.676 m)   Wt 148 lb 6.4 oz (67.3 kg)   SpO2 98%   BMI 23.95 kg/m        Wt Readings from Last 3 Encounters:  01/08/24 148 lb 6.4 oz (67.3 kg)  06/11/23 170 lb 9.6 oz (77.4 kg)  06/06/23 170 lb 9.6 oz (77.4 kg)    GEN: Well nourished, well developed in no acute distress NECK: No JVD; No carotid bruits CARDIAC: RRR, no murmurs, rubs, gallops RESPIRATORY:  Clear to auscultation without rales, wheezing or rhonchi  ABDOMEN: Soft, non-tender, non-distended EXTREMITIES:  No edema; No deformity   ASSESSMENT AND PLAN  HFpEF Echo in 05/2023 showed LVEF 60-65%, no WMA, G1DD, mild MR, small effusion with no tamponade. The patient is euvolemic on exam. Continue hydrochlorothiazide  12.5mg  daily.   Tobacco use She is smoking occasionally, complete cessation  recommended.   Coronary calcification She denies chest pain. She has SOB from diarrhea/dehydration. No further work-up at this time. Continue ASA, Lipitor and BB.   Small pericardial effusion This was seen on the last echo 05/2023, small effusion with no tamponade. Repeat limited echo.   HLD LDL 36. Continue Lipitor 40mg  daily.   Pre-op CV evaluation GI issues/Diarrhea She reports diarrhea for months with a 25lb weight loss. She finally saw GI who plans on EGD/colonoscopy at the end of October. The patient reports she is tired and weak from GI issues, but is still able to perform ADLs and function normally. She is stable from a cardiac perspective. METs>4. RCRI= 1.1% risk of MACE.  Ok to hold ASA prior to surgery per surgeon's recommendations. No prior cardiac work-up prior to procedure.         Dispo: Follow-up in 6 months  Signed, Fedora Knisely VEAR Fishman, PA-C

## 2024-01-08 NOTE — Patient Instructions (Signed)
 Medication Instructions:  Your physician recommends that you continue on your current medications as directed. Please refer to the Current Medication list given to you today.    *If you need a refill on your cardiac medications before your next appointment, please call your pharmacy*  Lab Work: No labs ordered today    Testing/Procedures: Your physician has requested that you have an limited echocardiogram prior to 6 month follow up . Echocardiography is a painless test that uses sound waves to create images of your heart. It provides your doctor with information about the size and shape of your heart and how well your heart's chambers and valves are working.   You may receive an ultrasound enhancing agent through an IV if needed to better visualize your heart during the echo. This procedure takes approximately one hour.  There are no restrictions for this procedure.  This will take place at [address] [facility]) [address], [place] [address]  Please note: We ask at that you not bring children with you during ultrasound (echo/ vascular) testing. Due to room size and safety concerns, children are not allowed in the ultrasound rooms during exams. Our front office staff cannot provide observation of children in our lobby area while testing is being conducted. An adult accompanying a patient to their appointment will only be allowed in the ultrasound room at the discretion of the ultrasound technician under special circumstances. We apologize for any inconvenience.   Follow-Up: At [facility], you and your health needs are our priority.  As part of our continuing mission to provide you with exceptional heart care, our providers are all part of one team.  This team includes your primary Cardiologist (physician) and Advanced Practice Providers or APPs (Physician Assistants and Nurse Practitioners) who all work together to provide you with the care you need, when you need  it.  Your next appointment:   6 month(s)  Provider:   Deatrice Cage, MD or Cadence Franchester, PA-C

## 2024-01-09 ENCOUNTER — Ambulatory Visit: Admitting: Medical

## 2024-02-05 ENCOUNTER — Encounter: Payer: Self-pay | Admitting: *Deleted

## 2024-02-17 ENCOUNTER — Encounter: Admission: RE | Payer: Self-pay | Source: Home / Self Care

## 2024-02-17 ENCOUNTER — Ambulatory Visit: Admission: RE | Admit: 2024-02-17 | Source: Home / Self Care

## 2024-02-17 HISTORY — DX: Depression, unspecified: F32.A

## 2024-02-17 SURGERY — COLONOSCOPY
Anesthesia: General

## 2024-03-02 ENCOUNTER — Ambulatory Visit: Attending: Medical

## 2024-03-02 DIAGNOSIS — I3139 Other pericardial effusion (noninflammatory): Secondary | ICD-10-CM | POA: Diagnosis not present

## 2024-03-03 LAB — ECHOCARDIOGRAM LIMITED
AV Mean grad: 4 mmHg
AV Peak grad: 8 mmHg
Ao pk vel: 1.41 m/s

## 2024-03-04 ENCOUNTER — Ambulatory Visit: Payer: Self-pay | Admitting: Medical

## 2024-03-18 ENCOUNTER — Telehealth: Payer: Self-pay | Admitting: Medical

## 2024-03-18 MED ORDER — METOPROLOL TARTRATE 50 MG PO TABS: 100.0000 mg | ORAL_TABLET | Freq: Two times a day (BID) | ORAL | 4 refills | Status: AC

## 2024-03-18 NOTE — Telephone Encounter (Signed)
 Refill sent.

## 2024-03-18 NOTE — Telephone Encounter (Signed)
*  STAT* If patient is at the pharmacy, call can be transferred to refill team.   1. Which medications need to be refilled? (please list name of each medication and dose if known)   metoprolol  tartrate (LOPRESSOR ) 50 MG tablet     2. Would you like to learn more about the convenience, safety, & potential cost savings by using the [facility]? No    3. Are you open to using the [facility] ([facility]. ). No    4. Which pharmacy/location (including street and city if local pharmacy) is medication to be sent to? [facility] [address] [place], [place], [place] [address]   5. Do they need a 30 day or 90 day supply? 90 day   Pt is out of medication

## 2024-05-17 ENCOUNTER — Other Ambulatory Visit: Payer: Self-pay

## 2024-05-18 ENCOUNTER — Telehealth: Payer: Self-pay | Admitting: Medical

## 2024-05-18 ENCOUNTER — Telehealth: Payer: Self-pay | Admitting: Cardiovascular Disease

## 2024-05-18 NOTE — Telephone Encounter (Signed)
" °*  STAT* If patient is at the pharmacy, call can be transferred to refill team.   1. Which medications need to be refilled? (please list name of each medication and dose if known) hydrochlorothiazide  (MICROZIDE ) 12.5 MG capsule    2. Would you like to learn more about the convenience, safety, & potential cost savings by using the [facility]? No   3. Are you open to using the [facility] ([facility].  ). No   4. Which pharmacy/location (including street and city if local pharmacy) is medication to be sent to?[id number] - [place], [place] - [address] AT [facility] [place] [facility] OF [address]    5. Do they need a 30 day or 90 day supply? 90    Pt is currently out  "

## 2024-05-18 NOTE — Telephone Encounter (Signed)
" °*  STAT* If patient is at the pharmacy, call can be transferred to refill team.   1. Which medications need to be refilled? (please list name of each medication and dose if known)   hydrochlorothiazide  (MICROZIDE ) 12.5 MG capsule   2. Would you like to learn more about the convenience, safety, & potential cost savings by using the [facility]?   3. Are you open to using the [facility] ([facility]. ).  4. Which pharmacy/location (including street and city if local pharmacy) is medication to be sent to?  [id number] - [place],  - [address] AT [facility] OF [address]   5. Do they need a 30 day or 90 day supply?   90 day  Patient stated she has a couple of capsules left.   Patient has appointment scheduled with C. Franchester, PA on 3/10. "

## 2024-05-20 MED ORDER — HYDROCHLOROTHIAZIDE 12.5 MG PO CAPS: 12.5000 mg | ORAL_CAPSULE | Freq: Every day | ORAL | 2 refills | Status: AC

## 2024-05-20 NOTE — Telephone Encounter (Signed)
 Pt's medication was sent to pt's pharmacy as requested. Confirmation received.

## 2024-07-06 ENCOUNTER — Ambulatory Visit: Admitting: Medical
